# Patient Record
Sex: Male | Born: 1935 | Race: White | Hispanic: No | Marital: Married | Smoking: Former smoker
Health system: Southern US, Community
[De-identification: ages and names within clinical notes are randomized; demographics above are authoritative.]

## PROBLEM LIST (undated history)

## (undated) DIAGNOSIS — L299 Pruritus, unspecified: Secondary | ICD-10-CM

## (undated) DIAGNOSIS — Z86718 Personal history of other venous thrombosis and embolism: Secondary | ICD-10-CM

## (undated) DIAGNOSIS — I350 Nonrheumatic aortic (valve) stenosis: Secondary | ICD-10-CM

## (undated) DIAGNOSIS — I739 Peripheral vascular disease, unspecified: Secondary | ICD-10-CM

## (undated) DIAGNOSIS — I509 Heart failure, unspecified: Secondary | ICD-10-CM

## (undated) DIAGNOSIS — M1710 Unilateral primary osteoarthritis, unspecified knee: Secondary | ICD-10-CM

## (undated) DIAGNOSIS — Z22322 Carrier or suspected carrier of Methicillin resistant Staphylococcus aureus: Secondary | ICD-10-CM

## (undated) DIAGNOSIS — I4891 Unspecified atrial fibrillation: Secondary | ICD-10-CM

## (undated) DIAGNOSIS — N401 Enlarged prostate with lower urinary tract symptoms: Secondary | ICD-10-CM

## (undated) DIAGNOSIS — I10 Essential (primary) hypertension: Secondary | ICD-10-CM

## (undated) DIAGNOSIS — I442 Atrioventricular block, complete: Secondary | ICD-10-CM

## (undated) DIAGNOSIS — E43 Unspecified severe protein-calorie malnutrition: Secondary | ICD-10-CM

## (undated) DIAGNOSIS — I70219 Atherosclerosis of native arteries of extremities with intermittent claudication, unspecified extremity: Principal | ICD-10-CM

## (undated) DIAGNOSIS — Z950 Presence of cardiac pacemaker: Secondary | ICD-10-CM

## (undated) DIAGNOSIS — R636 Underweight: Secondary | ICD-10-CM

## (undated) DIAGNOSIS — L89309 Pressure ulcer of unspecified buttock, unspecified stage: Secondary | ICD-10-CM

## (undated) DIAGNOSIS — K2270 Barrett's esophagus without dysplasia: Secondary | ICD-10-CM

## (undated) DIAGNOSIS — F0391 Unspecified dementia with behavioral disturbance: Secondary | ICD-10-CM

## (undated) DIAGNOSIS — K219 Gastro-esophageal reflux disease without esophagitis: Secondary | ICD-10-CM

## (undated) DIAGNOSIS — IMO0002 Reserved for concepts with insufficient information to code with codable children: Secondary | ICD-10-CM

## (undated) HISTORY — PX: SKIN GRAFT: SHX250

## (undated) HISTORY — PX: TONSILLECTOMY: SUR1361

## (undated) HISTORY — DX: Reserved for concepts with insufficient information to code with codable children: IMO0002

## (undated) HISTORY — DX: Pressure ulcer of unspecified buttock, unspecified stage: L89.309

## (undated) HISTORY — DX: Presence of cardiac pacemaker: Z95.0

## (undated) HISTORY — DX: Atrioventricular block, complete: I44.2

## (undated) HISTORY — DX: Unspecified atrial fibrillation: I48.91

## (undated) HISTORY — DX: Pruritus, unspecified: L29.9

## (undated) HISTORY — DX: Heart failure, unspecified: I50.9

## (undated) HISTORY — DX: Unilateral primary osteoarthritis, unspecified knee: M17.10

## (undated) HISTORY — DX: Gastro-esophageal reflux disease without esophagitis: K21.9

## (undated) HISTORY — DX: Personal history of other venous thrombosis and embolism: Z86.718

## (undated) HISTORY — DX: Benign prostatic hyperplasia with lower urinary tract symptoms: N40.1

## (undated) HISTORY — PX: PACEMAKER INSERTION: SHX728

## (undated) HISTORY — DX: Essential (primary) hypertension: I10

## (undated) HISTORY — PX: FRACTURE SURGERY: SHX138

## (undated) HISTORY — PX: CATARACT EXTRACTION: SUR2

## (undated) HISTORY — DX: Peripheral vascular disease, unspecified: I73.9

## (undated) HISTORY — DX: Barrett's esophagus without dysplasia: K22.70

## (undated) HISTORY — DX: Atherosclerosis of native arteries of extremities with intermittent claudication, unspecified extremity: I70.219

---

## 2004-06-25 ENCOUNTER — Ambulatory Visit: Payer: Self-pay | Admitting: Internal Medicine

## 2004-06-25 ENCOUNTER — Ambulatory Visit: Payer: Self-pay

## 2004-07-31 ENCOUNTER — Ambulatory Visit: Payer: Self-pay | Admitting: Internal Medicine

## 2004-08-23 ENCOUNTER — Ambulatory Visit: Payer: Self-pay | Admitting: Cardiology

## 2004-09-03 ENCOUNTER — Ambulatory Visit: Payer: Self-pay | Admitting: Cardiology

## 2004-09-11 ENCOUNTER — Ambulatory Visit: Payer: Self-pay | Admitting: Internal Medicine

## 2004-09-16 ENCOUNTER — Ambulatory Visit: Payer: Self-pay | Admitting: Internal Medicine

## 2004-09-23 ENCOUNTER — Ambulatory Visit: Payer: Self-pay | Admitting: Internal Medicine

## 2004-09-30 ENCOUNTER — Ambulatory Visit: Payer: Self-pay | Admitting: Internal Medicine

## 2004-10-10 ENCOUNTER — Ambulatory Visit: Payer: Self-pay | Admitting: Cardiology

## 2004-11-03 ENCOUNTER — Ambulatory Visit: Payer: Self-pay | Admitting: Cardiology

## 2004-12-05 ENCOUNTER — Ambulatory Visit: Payer: Self-pay | Admitting: Cardiology

## 2005-01-08 ENCOUNTER — Ambulatory Visit: Payer: Self-pay | Admitting: Cardiology

## 2005-02-09 ENCOUNTER — Ambulatory Visit: Payer: Self-pay | Admitting: Cardiology

## 2005-03-13 ENCOUNTER — Ambulatory Visit: Payer: Self-pay | Admitting: Cardiology

## 2005-04-13 ENCOUNTER — Ambulatory Visit: Payer: Self-pay | Admitting: Cardiology

## 2005-05-15 ENCOUNTER — Ambulatory Visit: Payer: Self-pay | Admitting: Cardiology

## 2005-06-22 ENCOUNTER — Ambulatory Visit: Payer: Self-pay | Admitting: Cardiology

## 2005-07-15 ENCOUNTER — Ambulatory Visit: Payer: Self-pay | Admitting: Internal Medicine

## 2005-07-21 ENCOUNTER — Ambulatory Visit: Payer: Self-pay | Admitting: Cardiology

## 2005-09-15 ENCOUNTER — Ambulatory Visit: Payer: Self-pay | Admitting: Cardiology

## 2005-10-05 ENCOUNTER — Ambulatory Visit: Payer: Self-pay | Admitting: Internal Medicine

## 2005-10-16 ENCOUNTER — Ambulatory Visit: Payer: Self-pay | Admitting: Cardiology

## 2005-10-27 ENCOUNTER — Emergency Department (HOSPITAL_COMMUNITY): Admission: EM | Admit: 2005-10-27 | Discharge: 2005-10-28 | Payer: Self-pay | Admitting: Emergency Medicine

## 2005-11-25 ENCOUNTER — Ambulatory Visit: Payer: Self-pay | Admitting: Cardiology

## 2006-01-05 ENCOUNTER — Ambulatory Visit: Payer: Self-pay | Admitting: Cardiology

## 2006-02-08 ENCOUNTER — Ambulatory Visit: Payer: Self-pay | Admitting: Cardiology

## 2006-03-08 ENCOUNTER — Ambulatory Visit: Payer: Self-pay | Admitting: Cardiology

## 2006-04-07 ENCOUNTER — Ambulatory Visit: Payer: Self-pay | Admitting: Cardiology

## 2006-05-05 ENCOUNTER — Ambulatory Visit: Payer: Self-pay | Admitting: Cardiology

## 2006-06-23 ENCOUNTER — Ambulatory Visit: Payer: Self-pay | Admitting: Cardiology

## 2006-06-30 ENCOUNTER — Ambulatory Visit: Payer: Self-pay | Admitting: Internal Medicine

## 2006-07-21 ENCOUNTER — Ambulatory Visit: Payer: Self-pay | Admitting: Cardiology

## 2006-08-17 ENCOUNTER — Ambulatory Visit: Payer: Self-pay | Admitting: Cardiology

## 2006-09-15 ENCOUNTER — Ambulatory Visit: Payer: Self-pay | Admitting: Cardiology

## 2006-09-21 ENCOUNTER — Ambulatory Visit: Payer: Self-pay

## 2006-09-21 ENCOUNTER — Ambulatory Visit: Payer: Self-pay | Admitting: Cardiology

## 2006-09-21 LAB — CONVERTED CEMR LAB
CO2: 36 meq/L — ABNORMAL HIGH (ref 19–32)
Calcium: 8.9 mg/dL (ref 8.4–10.5)
Chloride: 103 meq/L (ref 96–112)
Creatinine, Ser: 1.1 mg/dL (ref 0.4–1.5)
Glucose, Bld: 108 mg/dL — ABNORMAL HIGH (ref 70–99)

## 2006-10-04 ENCOUNTER — Ambulatory Visit: Payer: Self-pay | Admitting: Internal Medicine

## 2006-10-04 LAB — CONVERTED CEMR LAB
ALT: 20 units/L (ref 0–40)
AST: 19 units/L (ref 0–37)
Albumin: 3.5 g/dL (ref 3.5–5.2)
Alkaline Phosphatase: 86 units/L (ref 39–117)
Bilirubin, Direct: 0.2 mg/dL (ref 0.0–0.3)
Total Bilirubin: 0.9 mg/dL (ref 0.3–1.2)
Total Protein: 6.1 g/dL (ref 6.0–8.3)

## 2006-10-13 ENCOUNTER — Ambulatory Visit: Payer: Self-pay | Admitting: Cardiology

## 2006-11-10 ENCOUNTER — Ambulatory Visit: Payer: Self-pay | Admitting: Cardiology

## 2006-11-12 ENCOUNTER — Ambulatory Visit: Payer: Self-pay | Admitting: Internal Medicine

## 2006-12-08 ENCOUNTER — Ambulatory Visit: Payer: Self-pay | Admitting: Cardiology

## 2007-01-05 ENCOUNTER — Ambulatory Visit: Payer: Self-pay | Admitting: Cardiology

## 2007-02-02 ENCOUNTER — Ambulatory Visit: Payer: Self-pay | Admitting: Cardiology

## 2007-03-02 ENCOUNTER — Ambulatory Visit: Payer: Self-pay | Admitting: Cardiology

## 2007-04-01 ENCOUNTER — Ambulatory Visit: Payer: Self-pay | Admitting: Cardiology

## 2007-04-27 ENCOUNTER — Ambulatory Visit: Payer: Self-pay | Admitting: Cardiology

## 2007-05-16 ENCOUNTER — Ambulatory Visit: Payer: Self-pay | Admitting: Internal Medicine

## 2007-05-16 DIAGNOSIS — K219 Gastro-esophageal reflux disease without esophagitis: Secondary | ICD-10-CM

## 2007-05-16 DIAGNOSIS — I10 Essential (primary) hypertension: Secondary | ICD-10-CM | POA: Insufficient documentation

## 2007-05-16 HISTORY — DX: Gastro-esophageal reflux disease without esophagitis: K21.9

## 2007-05-16 HISTORY — DX: Essential (primary) hypertension: I10

## 2007-05-25 ENCOUNTER — Ambulatory Visit: Payer: Self-pay | Admitting: Cardiology

## 2007-06-13 ENCOUNTER — Ambulatory Visit: Payer: Self-pay | Admitting: Internal Medicine

## 2007-06-22 ENCOUNTER — Ambulatory Visit: Payer: Self-pay | Admitting: Cardiology

## 2007-07-20 ENCOUNTER — Ambulatory Visit: Payer: Self-pay | Admitting: Cardiology

## 2007-08-17 ENCOUNTER — Ambulatory Visit: Payer: Self-pay | Admitting: Cardiology

## 2007-09-12 ENCOUNTER — Telehealth: Payer: Self-pay | Admitting: Internal Medicine

## 2007-10-13 ENCOUNTER — Ambulatory Visit: Payer: Self-pay | Admitting: Cardiology

## 2007-10-19 ENCOUNTER — Ambulatory Visit: Payer: Self-pay | Admitting: Internal Medicine

## 2007-10-31 ENCOUNTER — Ambulatory Visit: Payer: Self-pay | Admitting: Internal Medicine

## 2007-11-14 ENCOUNTER — Ambulatory Visit: Payer: Self-pay

## 2007-11-15 ENCOUNTER — Ambulatory Visit: Payer: Self-pay | Admitting: Internal Medicine

## 2007-11-15 LAB — CONVERTED CEMR LAB
Basophils Absolute: 0 10*3/uL (ref 0.0–0.1)
Basophils Relative: 0 % (ref 0.0–1.0)
Chloride: 101 meq/L (ref 96–112)
Creatinine, Ser: 1.1 mg/dL (ref 0.4–1.5)
Eosinophils Absolute: 0.1 10*3/uL (ref 0.0–0.7)
GFR calc Af Amer: 81 mL/min
GFR calc non Af Amer: 67 mL/min
HCT: 47.7 % (ref 39.0–52.0)
MCHC: 33.6 g/dL (ref 30.0–36.0)
MCV: 97.4 fL (ref 78.0–100.0)
Monocytes Absolute: 0.3 10*3/uL (ref 0.1–1.0)
Neutrophils Relative %: 79.4 % — ABNORMAL HIGH (ref 43.0–77.0)
Platelets: 232 10*3/uL (ref 150–400)
RBC: 4.9 M/uL (ref 4.22–5.81)
aPTT: 32.1 s — ABNORMAL HIGH (ref 21.7–29.8)

## 2007-11-18 ENCOUNTER — Ambulatory Visit (HOSPITAL_COMMUNITY): Admission: RE | Admit: 2007-11-18 | Discharge: 2007-11-18 | Payer: Self-pay | Admitting: Internal Medicine

## 2007-11-18 ENCOUNTER — Ambulatory Visit: Payer: Self-pay | Admitting: Internal Medicine

## 2007-11-28 ENCOUNTER — Ambulatory Visit: Payer: Self-pay | Admitting: Internal Medicine

## 2008-06-06 ENCOUNTER — Encounter: Payer: Self-pay | Admitting: Cardiology

## 2008-06-06 DIAGNOSIS — I442 Atrioventricular block, complete: Secondary | ICD-10-CM | POA: Insufficient documentation

## 2008-06-06 DIAGNOSIS — Z950 Presence of cardiac pacemaker: Secondary | ICD-10-CM

## 2008-06-06 DIAGNOSIS — I70219 Atherosclerosis of native arteries of extremities with intermittent claudication, unspecified extremity: Secondary | ICD-10-CM

## 2008-06-06 HISTORY — DX: Atherosclerosis of native arteries of extremities with intermittent claudication, unspecified extremity: I70.219

## 2008-06-06 HISTORY — DX: Presence of cardiac pacemaker: Z95.0

## 2008-06-06 HISTORY — DX: Atrioventricular block, complete: I44.2

## 2008-06-07 ENCOUNTER — Ambulatory Visit: Payer: Self-pay | Admitting: Cardiology

## 2008-06-11 ENCOUNTER — Ambulatory Visit: Payer: Self-pay | Admitting: Internal Medicine

## 2008-07-05 ENCOUNTER — Ambulatory Visit: Payer: Self-pay | Admitting: Internal Medicine

## 2008-07-12 ENCOUNTER — Ambulatory Visit: Payer: Self-pay | Admitting: Internal Medicine

## 2008-07-12 DIAGNOSIS — IMO0002 Reserved for concepts with insufficient information to code with codable children: Secondary | ICD-10-CM | POA: Insufficient documentation

## 2008-07-12 DIAGNOSIS — M1710 Unilateral primary osteoarthritis, unspecified knee: Secondary | ICD-10-CM

## 2008-07-12 HISTORY — DX: Reserved for concepts with insufficient information to code with codable children: IMO0002

## 2008-07-18 ENCOUNTER — Telehealth: Payer: Self-pay | Admitting: Internal Medicine

## 2008-07-23 ENCOUNTER — Encounter: Payer: Self-pay | Admitting: Internal Medicine

## 2008-08-07 ENCOUNTER — Ambulatory Visit: Payer: Self-pay | Admitting: Internal Medicine

## 2008-08-16 ENCOUNTER — Telehealth: Payer: Self-pay | Admitting: Internal Medicine

## 2008-10-23 ENCOUNTER — Telehealth: Payer: Self-pay | Admitting: Internal Medicine

## 2008-10-29 ENCOUNTER — Telehealth: Payer: Self-pay | Admitting: Internal Medicine

## 2008-11-07 ENCOUNTER — Encounter: Payer: Self-pay | Admitting: Cardiology

## 2008-11-14 ENCOUNTER — Encounter: Payer: Self-pay | Admitting: Internal Medicine

## 2008-11-14 ENCOUNTER — Ambulatory Visit: Payer: Self-pay | Admitting: Internal Medicine

## 2008-12-31 ENCOUNTER — Ambulatory Visit: Payer: Self-pay | Admitting: Cardiology

## 2009-04-02 ENCOUNTER — Ambulatory Visit: Payer: Self-pay | Admitting: Cardiology

## 2009-05-09 ENCOUNTER — Encounter: Payer: Self-pay | Admitting: Internal Medicine

## 2009-06-04 ENCOUNTER — Ambulatory Visit: Payer: Self-pay | Admitting: Internal Medicine

## 2009-06-04 DIAGNOSIS — Z86718 Personal history of other venous thrombosis and embolism: Secondary | ICD-10-CM

## 2009-06-04 DIAGNOSIS — L89609 Pressure ulcer of unspecified heel, unspecified stage: Secondary | ICD-10-CM | POA: Insufficient documentation

## 2009-06-04 HISTORY — DX: Personal history of other venous thrombosis and embolism: Z86.718

## 2009-07-02 ENCOUNTER — Ambulatory Visit: Payer: Self-pay | Admitting: Cardiology

## 2009-07-02 ENCOUNTER — Ambulatory Visit: Payer: Self-pay | Admitting: Internal Medicine

## 2009-07-02 DIAGNOSIS — L89309 Pressure ulcer of unspecified buttock, unspecified stage: Secondary | ICD-10-CM | POA: Insufficient documentation

## 2009-07-03 ENCOUNTER — Telehealth: Payer: Self-pay | Admitting: Internal Medicine

## 2009-07-06 ENCOUNTER — Telehealth: Payer: Self-pay | Admitting: Family Medicine

## 2009-07-17 ENCOUNTER — Ambulatory Visit: Payer: Self-pay | Admitting: Internal Medicine

## 2009-07-21 ENCOUNTER — Ambulatory Visit: Payer: Self-pay | Admitting: Internal Medicine

## 2009-07-23 ENCOUNTER — Telehealth: Payer: Self-pay | Admitting: Family Medicine

## 2009-08-05 ENCOUNTER — Ambulatory Visit: Payer: Self-pay | Admitting: Internal Medicine

## 2009-08-13 ENCOUNTER — Telehealth: Payer: Self-pay | Admitting: Internal Medicine

## 2009-08-16 ENCOUNTER — Encounter: Payer: Self-pay | Admitting: Internal Medicine

## 2009-08-17 ENCOUNTER — Emergency Department (HOSPITAL_COMMUNITY): Admission: EM | Admit: 2009-08-17 | Discharge: 2009-08-17 | Payer: Self-pay | Admitting: Emergency Medicine

## 2009-08-19 ENCOUNTER — Telehealth: Payer: Self-pay | Admitting: Internal Medicine

## 2009-08-23 ENCOUNTER — Encounter: Payer: Self-pay | Admitting: Internal Medicine

## 2009-08-23 ENCOUNTER — Encounter: Payer: Self-pay | Admitting: Cardiology

## 2009-08-27 ENCOUNTER — Telehealth: Payer: Self-pay | Admitting: Internal Medicine

## 2009-08-27 ENCOUNTER — Encounter: Payer: Self-pay | Admitting: Internal Medicine

## 2009-08-27 ENCOUNTER — Encounter: Payer: Self-pay | Admitting: Cardiology

## 2009-08-29 ENCOUNTER — Encounter: Payer: Self-pay | Admitting: Internal Medicine

## 2009-09-02 ENCOUNTER — Ambulatory Visit: Payer: Self-pay | Admitting: Internal Medicine

## 2009-09-02 DIAGNOSIS — N138 Other obstructive and reflux uropathy: Secondary | ICD-10-CM

## 2009-09-02 DIAGNOSIS — N401 Enlarged prostate with lower urinary tract symptoms: Secondary | ICD-10-CM | POA: Insufficient documentation

## 2009-09-02 HISTORY — DX: Other obstructive and reflux uropathy: N13.8

## 2009-09-03 ENCOUNTER — Encounter: Payer: Self-pay | Admitting: Internal Medicine

## 2009-09-03 LAB — CONVERTED CEMR LAB
ALT: 10 units/L (ref 0–53)
AST: 16 units/L (ref 0–37)
Alkaline Phosphatase: 86 units/L (ref 39–117)
BUN: 14 mg/dL (ref 6–23)
Basophils Relative: 0.1 % (ref 0.0–3.0)
Bilirubin, Direct: 0.2 mg/dL (ref 0.0–0.3)
Calcium: 9 mg/dL (ref 8.4–10.5)
Chloride: 107 meq/L (ref 96–112)
Creatinine, Ser: 1 mg/dL (ref 0.4–1.5)
Eosinophils Relative: 2.4 % (ref 0.0–5.0)
GFR calc non Af Amer: 74.67 mL/min (ref >60)
Lymphocytes Relative: 13.9 % (ref 12.0–46.0)
MCV: 95.3 fL (ref 78.0–100.0)
Monocytes Relative: 7.7 % (ref 3.0–12.0)
Neutrophils Relative %: 75.9 % (ref 43.0–77.0)
Platelets: 197 10*3/uL (ref 150.0–400.0)
RBC: 4.23 M/uL (ref 4.22–5.81)
Total Bilirubin: 0.8 mg/dL (ref 0.3–1.2)
Total Protein: 6.2 g/dL (ref 6.0–8.3)
WBC: 5.9 10*3/uL (ref 4.5–10.5)

## 2009-09-11 ENCOUNTER — Encounter (INDEPENDENT_AMBULATORY_CARE_PROVIDER_SITE_OTHER): Payer: Self-pay | Admitting: Orthopedic Surgery

## 2009-09-11 ENCOUNTER — Ambulatory Visit (HOSPITAL_COMMUNITY): Admission: RE | Admit: 2009-09-11 | Discharge: 2009-09-11 | Payer: Self-pay | Admitting: Orthopedic Surgery

## 2009-09-11 ENCOUNTER — Ambulatory Visit: Payer: Self-pay | Admitting: Vascular Surgery

## 2009-09-16 ENCOUNTER — Encounter: Payer: Self-pay | Admitting: Internal Medicine

## 2009-09-30 ENCOUNTER — Encounter: Admission: RE | Admit: 2009-09-30 | Discharge: 2009-12-29 | Payer: Self-pay | Admitting: Internal Medicine

## 2009-10-01 ENCOUNTER — Ambulatory Visit: Payer: Self-pay | Admitting: Cardiology

## 2009-10-03 ENCOUNTER — Telehealth: Payer: Self-pay | Admitting: Internal Medicine

## 2009-10-29 ENCOUNTER — Encounter: Payer: Self-pay | Admitting: Cardiology

## 2009-10-29 ENCOUNTER — Encounter: Payer: Self-pay | Admitting: Internal Medicine

## 2009-11-03 ENCOUNTER — Encounter: Payer: Self-pay | Admitting: Internal Medicine

## 2009-11-04 ENCOUNTER — Ambulatory Visit: Payer: Self-pay | Admitting: Internal Medicine

## 2009-11-13 ENCOUNTER — Ambulatory Visit: Payer: Self-pay | Admitting: Internal Medicine

## 2009-11-19 ENCOUNTER — Ambulatory Visit: Payer: Self-pay | Admitting: Internal Medicine

## 2009-11-27 ENCOUNTER — Encounter: Payer: Self-pay | Admitting: Internal Medicine

## 2009-11-27 ENCOUNTER — Telehealth: Payer: Self-pay | Admitting: Internal Medicine

## 2009-12-25 ENCOUNTER — Encounter: Payer: Self-pay | Admitting: Internal Medicine

## 2010-05-01 ENCOUNTER — Ambulatory Visit: Payer: Self-pay | Admitting: Cardiology

## 2010-05-23 ENCOUNTER — Encounter (INDEPENDENT_AMBULATORY_CARE_PROVIDER_SITE_OTHER): Payer: Self-pay | Admitting: *Deleted

## 2010-05-26 ENCOUNTER — Ambulatory Visit: Payer: Self-pay | Admitting: Internal Medicine

## 2010-07-31 ENCOUNTER — Encounter: Payer: Self-pay | Admitting: Cardiology

## 2010-08-15 ENCOUNTER — Ambulatory Visit: Payer: Self-pay

## 2010-08-25 ENCOUNTER — Encounter: Payer: Self-pay | Admitting: Internal Medicine

## 2010-08-26 NOTE — Progress Notes (Signed)
Summary: [facility] called re: status power wheelchair paperwork  Phone Note From Other Clinic Call back at [facility] [phone] Baird Lyons   Caller: [facility] - Baird Lyons Summary of Call: Calling to check on status of paperwork for power wheelchair. Faxed on 11/18/09. Needed to be completed and signed asap. Please call.  Initial call taken by: Lucy Antigua,  Nov 27, 2009 10:00 AM  Follow-up for Phone Call        form completed and faxed.jennifer at [facility] notified. Follow-up by: Gladis Riffle, RN,  Nov 27, 2009 3:26 PM

## 2010-08-26 NOTE — Cardiovascular Report (Signed)
Summary: Office Visit   Office Visit   Imported By: Roderic Ovens 11/25/2009 15:23:11  _____________________________________________________________________  External Attachment:    Type:   Image     Comment:   External Document

## 2010-08-26 NOTE — Miscellaneous (Signed)
Summary: Physician's Orders/[facility]  Physician's Orders/[facility]   Imported By: Maryln Gottron 09/03/2009 12:46:15  _____________________________________________________________________  External Attachment:    Type:   Image     Comment:   External Document

## 2010-08-26 NOTE — Progress Notes (Signed)
Summary: advice andrequest for Nasonex  Phone Note Call from Patient   Caller: Spouse Call For: Birdie Sons MD Summary of Call: Pt's wife calls stating pt's bed sores on buttocks are worse and needs some advice.  They do not have home health anymore.  He would like to have a written prescription for Nasonex to send in to their mail order pharmacy.  The power chair is delayed for 50 more days. [phone] Initial call taken by: Lynann Beaver CMA,  October 03, 2009 10:13 AM  Follow-up for Phone Call        nasonex ok call vendor for wheelchair home health skin care Follow-up by: Birdie Sons MD,  October 03, 2009 11:52 AM    New/Updated Medications: NASONEX 50 MCG/ACT SUSP (MOMETASONE FUROATE) 2 sprays each nostril once daily Prescriptions: NASONEX 50 MCG/ACT SUSP (MOMETASONE FUROATE) 2 sprays each nostril once daily  #1 x 6   Entered by:   Lynann Beaver CMA   Authorized by:   Birdie Sons MD   Signed by:   Lynann Beaver CMA on 10/03/2009   Method used:   Print then Give to Patient   RxID:   [id number]  Pt's wife is keeping in touch with the vendor for the chair........it is just delayed in processing, they tell her. Home Health referral sent to [facility].

## 2010-08-26 NOTE — Medication Information (Signed)
Summary: Order for Power Wheelchair  Order for Power Wheelchair   Imported By: Maryln Gottron 12/04/2009 15:51:29  _____________________________________________________________________  External Attachment:    Type:   Image     Comment:   External Document

## 2010-08-26 NOTE — Assessment & Plan Note (Signed)
Summary: pc2  Medications Added NASONEX 50 MCG/ACT SUSP (MOMETASONE FUROATE) UAD      Allergies Added:   Visit Type:  Follow-up Primary Provider:  Birdie Sons MD   History of Present Illness: [name] [name] returns today for follow-up.  He is a pleasant elderly man with a h/o symptomatic CHB, s/p PPM in 1996 and a generator change two years ago.  He has been stable except that he tripped, fell and broke his hip.  He denies palpitations, c/p, or SOB.  He has mild peripheral edema which has been chronic. He is bothered by severe arthritis in his knees.  Current Medications (verified): 1)  Nexium 40 Mg  Cpdr (Esomeprazole Magnesium) .... One By Mouth Deaily 2)  Fexofenadine Hcl 180 Mg  Tabs (Fexofenadine Hcl) .... Take 1 Tablet By Mouth Once A Day As Needed 3)  Mirapex 0.25 Mg Tabs (Pramipexole Dihydrochloride) .... Take 1 Tablet By Mouth At Bedtime As Needed 4)  Aspirin 81 Mg Tbec (Aspirin) .... Once Daily 5)  Multivitamins  Tabs (Multiple Vitamin) .... Once Daily 6)  Nasonex 50 Mcg/act Susp (Mometasone Furoate) .... Uad 7)  Calcium-Vitamin D 600-200 Mg-Unit Tabs (Calcium-Vitamin D) .... Two Times A Day 8)  Hydrocodone-Acetaminophen 5-325 Mg Tabs (Hydrocodone-Acetaminophen) .Marland Kitchen.. 1 By Mouth Up To 4 Times Per Day As Needed For Pain 9)  Flomax 0.4 Mg Caps (Tamsulosin Hcl) .... Take 1 Tablet By Mouth Once A Day  Allergies (verified): 1)  ! Cortisone Acetate (Cortisone Acetate)  Past History:  Past Medical History: Last updated: 06/04/2009 .Current Problems:  PRURITUS (ICD-698.9) OSTEOARTHRITIS, KNEES, BILATERAL (ICD-715.96) DECUBITUS ULCER, BUTTOCK (ICD-707.05) HYPERTENSION, BENIGN (ICD-401.1) ATHEROSCLEROSIS W /INT CLAUDICATION (ICD-440.21) PACEMAKER (ICD-V45.Marland Kitchen01) AV BLOCK, COMPLETE (ICD-426.0) FUNGAL DERMATITIS (ICD-111.9) ULCER, DECUBITUS, LOWER BACK (ICD-707.03) HYPERTENSION (ICD-401.9) GERD (ICD-530.81) 1. Status post synchrony II DDD pacemaker implantation in 1996 for AV  block, S/P generator change with [facility] DDD pacer 4/09 2. Recent onset of probable atrial fibrillation. 3. Peripheral vascular disease with reduced ABIs of 0.47 and 0.43. 4. Hypertension.  DVT, hx of  Past Surgical History: Last updated: 06/04/2009 PPM 11/18/2007  Doylene Canning. Ladona Ridgel, MD   Cataract Surgery hip fracture---2010 graft---skin to heel  Review of Systems  The patient denies chest pain, syncope, dyspnea on exertion, and peripheral edema.    Vital Signs:  Patient profile:   77 year old male Height:      70 inches Weight:      149 pounds Pulse rate:   89 / minute BP sitting:   80 / 50  (left arm)  Vitals Entered By: Laurance Flatten CMA (November 19, 2009 3:30 PM)  Physical Exam  General:  elderly man sitting in a wheelchair. Head:  normocephalic and atraumatic.   Eyes:  pupils equal and pupils round.   Neck:  Neck supple, no JVD. No masses, thyromegaly or abnormal cervical nodes. Chest Wall:  Well healed pacemaker incision. Lungs:  Clear bilaterally to auscultation with no wheezes, rales, or rhonchi. Heart:  Normal rate and regular rhythm. S1 and S2 normal without gallop, murmur, click, rub or other extra sounds. Abdomen:  Bowel sounds positive,abdomen soft and non-tender without masses, organomegaly or hernias noted. Msk:  No deformity or scoliosis noted of thoracic or lumbar spine.  Severe arthritis in the knees. Pulses:  R radial normal and L radial normal.   Extremities:  2+ left pedal edema and 2+ right pedal edema.   Neurologic:  cranial nerves II-XII intact.  in wheelchair   PPM Specifications  Following MD:  Everardo Beals Juanda Chance, MD     PPM Vendor:  [employer]     PPM Model Number:  [id number]     PPM Serial Number:  [id number] PPM DOI:  11/18/2007     PPM Implanting MD:  Everardo Beals. Juanda Chance, MD  Lead 1    Location: RA     DOI: 06/11/1995     Model #: [id number]     Serial #: CC     Status: [id number] Lead 2    Location: A     Model #: 06/11/1995     Serial #: [id number]     Status:  [id number] Lead 3    Location: A      Magnet Response Rate:  BOL 98.6 ERI 86.3  Indications:  TTM'S with [employer]   PPM Follow Up Remote Check?  No Battery Voltage:  2.81 V     Battery Est. Longevity:  7 years     Pacer Dependent:  No       PPM Device Measurements Atrium  Amplitude: 2.3 mV, Impedance: 338 ohms, Threshold: 0.75 V at 0.5 msec Right Ventricle  Amplitude: 5.7 mV, Impedance: 563 ohms, Threshold: 0.5 V at 0.5 msec  Episodes MS Episodes:  35659     Percent Mode Switch:  5.2%     Atrial Pacing:  12%     Ventricular Pacing:  88%  Parameters Mode:  DDD     Lower Rate Limit:  60     Upper Rate Limit:  100 Paced AV Delay:  150     Sensed AV Delay:  170 Next Cardiology Appt Due:  04/26/2010 Tech Comments:  No parameter changes.  TTM's with [employer].  Checked by [employer].  ROV 6 months clinic. Altha Harm, LPN  November 19, 2009 3:38 PM  MD Comments:  Agree with above.  Impression & Recommendations:  Problem # 1:  PACEMAKER (ICD-V45.Marland Kitchen01) His device is working normally.  Will recheck in several months.  Problem # 2:  HYPERTENSION (ICD-401.9) His blood pressure is now on the low side and he is encouraged to increase fluid and salt intake. His updated medication list for this problem includes:    Aspirin 81 Mg Tbec (Aspirin) ..... Once daily  Patient Instructions: 1)  Your physician recommends that you schedule a follow-up appointment in: 6 months with device clinic and 12 months with Dr Ladona Ridgel

## 2010-08-26 NOTE — Letter (Signed)
Summary: [facility] Office Visit Note  [facility] Office Visit Note   Imported By: Roderic Ovens 03/04/2010 12:45:02  _____________________________________________________________________  External Attachment:    Type:   Image     Comment:   External Document

## 2010-08-26 NOTE — Medication Information (Signed)
Summary: Order for Wheelchair  Order for Wheelchair   Imported By: Maryln Gottron 09/02/2009 10:12:13  _____________________________________________________________________  External Attachment:    Type:   Image     Comment:   External Document

## 2010-08-26 NOTE — Cardiovascular Report (Signed)
Summary: TTM   TTM   Imported By: Roderic Ovens 09/03/2009 16:02:15  _____________________________________________________________________  External Attachment:    Type:   Image     Comment:   External Document

## 2010-08-26 NOTE — Cardiovascular Report (Signed)
Summary: TTM   TTM   Imported By: Roderic Ovens 10/24/2009 13:34:27  _____________________________________________________________________  External Attachment:    Type:   Image     Comment:   External Document

## 2010-08-26 NOTE — Progress Notes (Signed)
Summary: [facility] inquiry  Phone Note From Other Clinic   Caller: scooter Nurse, adult Call For: [employer] Summary of Call: Have you decided to go with [employer] or another company?   Can I be of any assistance?  In home eval per pt Thurs.   Call me at [phone] or fax [phone] paperwork or cover sheet which way.   Initial call taken by: Rudy Jew, RN,  August 27, 2009 11:44 AM  Follow-up for Phone Call        pt is making a decision Follow-up by: Birdie Sons MD,  August 27, 2009 12:33 PM  Additional Follow-up for Phone Call Additional follow up Details #1::        Phone call completed Additional Follow-up by: Rudy Jew, RN,  August 27, 2009 1:36 PM

## 2010-08-26 NOTE — Progress Notes (Signed)
Summary: needs status  Phone Note Other Incoming Call back at [phone]   Caller: [facility] Summary of Call: wants to know status of form for wheelchair. He wants nurse to return his call. Initial call taken by: Warnell Forester,  August 13, 2009 10:34 AM  Follow-up for Phone Call        [facility]  [facility] store notified they are in holding pattern until pt sees if can get locally. Follow-up by: Gladis Riffle, RN,  August 13, 2009 3:21 PM

## 2010-08-26 NOTE — Letter (Signed)
Summary: Records from [facility] 2010  Records from [facility] 2010   Imported By: Maryln Gottron 09/02/2009 14:30:09  _____________________________________________________________________  External Attachment:    Type:   Image     Comment:   External Document

## 2010-08-26 NOTE — Letter (Signed)
Summary: [facility]  [facility]   Imported By: Maryln Gottron 09/19/2009 11:17:32  _____________________________________________________________________  External Attachment:    Type:   Image     Comment:   External Document

## 2010-08-26 NOTE — Miscellaneous (Signed)
Summary: dx correction  Clinical Lists Changes  Problems: Changed problem from PACEMAKER (ICD-V45..01) to PACEMAKER, PERMANENT (ICD-V45.01) changed the incorrect dx code to correct dx code Donna Keene  May 23, 2010 12:37 PM 

## 2010-08-26 NOTE — Letter (Signed)
Summary: [facility]  [facility]   Imported By: Maryln Gottron 11/05/2009 12:42:56  _____________________________________________________________________  External Attachment:    Type:   Image     Comment:   External Document

## 2010-08-26 NOTE — Miscellaneous (Signed)
Summary: Certification and Plan of Care/[facility]  Certification and Plan of Care/[facility]   Imported By: Maryln Gottron 11/06/2009 10:43:32  _____________________________________________________________________  External Attachment:    Type:   Image     Comment:   External Document

## 2010-08-26 NOTE — Medication Information (Signed)
Summary: Rx for [employer]  Rx for [employer]   Imported By: Maryln Gottron 09/09/2009 15:19:54  _____________________________________________________________________  External Attachment:    Type:   Image     Comment:   External Document

## 2010-08-26 NOTE — Letter (Signed)
Summary: [facility] Office Note  [facility] Office Note   Imported By: Roderic Ovens 09/10/2009 14:10:05  _____________________________________________________________________  External Attachment:    Type:   Image     Comment:   External Document

## 2010-08-26 NOTE — Assessment & Plan Note (Signed)
Summary: 1 month rov/njr   Vital Signs:  Patient profile:   77 year old male Temp:     97.5 degrees F Pulse rate:   68 / minute Pulse rhythm:   irregular Resp:     12 per minute BP sitting:   128 / 78  (left arm)  Vitals Entered By: Gladis Riffle, RN (September 02, 2009 11:37 AM) CC: 1 month rov Is Patient Diabetic? No Comments went to ER 2 weeks ago for bladder pain, put on flomax with relief   CC:  1 month rov.  History of Present Illness: feels ok has had episode of urinary retention---see dr Charlott Holler note. tolerating flomax well  no wheelchair yet---working through paper work and bureaucracy  non healing wounds---home health nurse  OA knees-bilateral. he is essentially wheelchair bound due to pain and inability to walk  All other systems reviewed and were negative   Preventive Screening-Counseling & Management  Alcohol-Tobacco     Smoking Status: quit > 6 months     Year Started: 1942     Year Quit: 1946  Current Medications (verified): 1)  Nexium 40 Mg  Cpdr (Esomeprazole Magnesium) .... One By Mouth Deaily 2)  Fexofenadine Hcl 180 Mg  Tabs (Fexofenadine Hcl) .... Take 1 Tablet By Mouth Once A Day As Needed 3)  Mirapex 0.25 Mg Tabs (Pramipexole Dihydrochloride) .... Take 1 Tablet By Mouth At Bedtime As Needed 4)  Aspirin 81 Mg Tbec (Aspirin) .... Once Daily 5)  Multivitamins  Tabs (Multiple Vitamin) .... Once Daily 6)  Astepro 137 Mcg/spray Soln (Azelastine Hcl) .Marland Kitchen.. 1-2 Sprays Once Daily As Needed 7)  Calcium-Vitamin D 600-200 Mg-Unit Tabs (Calcium-Vitamin D) .... Two Times A Day 8)  Hydrocodone-Acetaminophen 5-325 Mg Tabs (Hydrocodone-Acetaminophen) .Marland Kitchen.. 1 By Mouth Up To 4 Times Per Day As Needed For Pain 9)  Flomax 0.4 Mg Caps (Tamsulosin Hcl) .... Take 1 Tablet By Mouth Once A Day  Allergies: 1)  ! Cortisone Acetate (Cortisone Acetate)  Past History:  Past Medical History: Last updated: 06/04/2009 .Current Problems:  PRURITUS  (ICD-698.9) OSTEOARTHRITIS, KNEES, BILATERAL (ICD-715.96) DECUBITUS ULCER, BUTTOCK (ICD-707.05) HYPERTENSION, BENIGN (ICD-401.1) ATHEROSCLEROSIS W /INT CLAUDICATION (ICD-440.21) PACEMAKER (ICD-V45.Marland Kitchen01) AV BLOCK, COMPLETE (ICD-426.0) FUNGAL DERMATITIS (ICD-111.9) ULCER, DECUBITUS, LOWER BACK (ICD-707.03) HYPERTENSION (ICD-401.9) GERD (ICD-530.81) 1. Status post synchrony II DDD pacemaker implantation in 1996 for AV     block, S/P generator change with [facility] DDD pacer 4/09 2. Recent onset of probable atrial fibrillation. 3. Peripheral vascular disease with reduced ABIs of 0.47 and 0.43. 4. Hypertension.  DVT, hx of  Past Surgical History: Last updated: 06/04/2009 PPM 11/18/2007  Doylene Canning. Ladona Ridgel, MD   Cataract Surgery hip fracture---2010 graft---skin to heel  Social History: Last updated: 05/16/2007 Married Never Smoked Regular exercise-no  Risk Factors: Exercise: no (05/16/2007)  Risk Factors: Smoking Status: quit > 6 months (09/02/2009)  Physical Exam  General:  elderly man sitting in a wheelchair. Head:  normocephalic and atraumatic.   Eyes:  pupils equal and pupils round.   Ears:  R ear normal and L ear normal.   Neck:  Neck supple, no JVD. No masses, thyromegaly or abnormal cervical nodes. Chest Wall:  Well healed pacemaker incision. Lungs:  Clear bilaterally to auscultation and percussion. Heart:  Normal rate and regular rhythm. S1 and S2 normal without gallop, murmur, click, rub or other extra sounds. Abdomen:  Bowel sounds positive,abdomen soft and non-tender without masses, organomegaly or hernias noted. Msk:  No deformity or scoliosis noted of  thoracic or lumbar spine.   Extremities:  2+ left pedal edema and 2+ right pedal edema.     Impression & Recommendations:  Problem # 1:  DECUBITUS ULCER, HEEL (ICD-707.07) has heeled thick eschar present  Problem # 2:  HYPERTENSION (ICD-401.9) adequate control continue current medications   Orders: Venipuncture (54098) TLB-BMP (Basic Metabolic Panel-BMET) (80048-METABOL)  BP today: 128/78 Prior BP: 122/80 (08/05/2009)  Labs Reviewed: K+: 4.2 (11/15/2007) Creat: : 1.1 (11/15/2007)     Problem # 3:  BENIGN PROSTATIC HYPERTROPHY, WITH OBSTRUCTION (ICD-600.01) much improved His updated medication list for this problem includes:    Flomax 0.4 Mg Caps (Tamsulosin hcl) .Marland Kitchen... Take 1 tablet by mouth once a day  Problem # 4:  OSTEOARTHRITIS, KNEES, BILATERAL (ICD-715.96) discussed at length. pt has done well with end stage OA of knees for years he is now immobile I am not sure what an orthopedic surgeon might have to offer but I'll refer.  Clearly not an ideal surgical candidate but if surgeon and patient concluded that surgery was the right choice I would clear him for surgery His updated medication list for this problem includes:    Aspirin 81 Mg Tbec (Aspirin) ..... Once daily    Hydrocodone-acetaminophen 5-325 Mg Tabs (Hydrocodone-acetaminophen) .Marland Kitchen... 1 by mouth up to 4 times per day as needed for pain  Orders: Orthopedic Surgeon Referral (Ortho Surgeon)  Complete Medication List: 1)  Nexium 40 Mg Cpdr (Esomeprazole magnesium) .... One by mouth deaily 2)  Fexofenadine Hcl 180 Mg Tabs (Fexofenadine hcl) .... Take 1 tablet by mouth once a day as needed 3)  Mirapex 0.25 Mg Tabs (Pramipexole dihydrochloride) .... Take 1 tablet by mouth at bedtime as needed 4)  Aspirin 81 Mg Tbec (Aspirin) .... Once daily 5)  Multivitamins Tabs (Multiple vitamin) .... Once daily 6)  Astepro 137 Mcg/spray Soln (Azelastine hcl) .Marland Kitchen.. 1-2 sprays once daily as needed 7)  Calcium-vitamin D 600-200 Mg-unit Tabs (Calcium-vitamin d) .... Two times a day 8)  Hydrocodone-acetaminophen 5-325 Mg Tabs (Hydrocodone-acetaminophen) .Marland Kitchen.. 1 by mouth up to 4 times per day as needed for pain 9)  Flomax 0.4 Mg Caps (Tamsulosin hcl) .... Take 1 tablet by mouth once a day  Other Orders: TLB-CBC Platelet -  w/Differential (85025-CBCD) TLB-Hepatic/Liver Function Pnl (80076-HEPATIC)

## 2010-08-26 NOTE — Letter (Signed)
Summary: [facility] Office Note  [facility] Office Note   Imported By: Roderic Ovens 09/10/2009 14:14:49  _____________________________________________________________________  External Attachment:    Type:   Image     Comment:   External Document

## 2010-08-26 NOTE — Letter (Signed)
Summary: [facility]  [facility]   Imported By: Maryln Gottron 09/16/2009 14:09:46  _____________________________________________________________________  External Attachment:    Type:   Image     Comment:   External Document

## 2010-08-26 NOTE — Assessment & Plan Note (Signed)
Summary: spot on leg that wont heal//mhf   Vital Signs:  Patient Profile:   77 Years Old Male Pulse rate:   64 / minute Resp:     16 per minute BP sitting:   138 / 68  (left arm)  Vitals Entered By: Gladis Riffle, RN (October 19, 2007 2:52 PM)                 Chief Complaint:  c/o lesion near buttocks that does not heal x 4-5 weeks and some drainage--has tried triamcinolone cream.  History of Present Illness: non healing wound on buttocks he sits a lot.     Current Allergies (reviewed today): No known allergies         Impression & Recommendations:  Problem # 1:  FUNGAL DERMATITIS (ICD-111.9) he has a scaly, erythematous, well demarcated rash underneath the left buttock.  The central area is quite raw.  I think this is a fungal dermatitis.  Will treat accordingly.  They will call me if his symptoms persist or do not improve or worsen.  Total time 20 minutes His updated medication list for this problem includes:    Lotrisone 1-0.05 % Crea (Clotrimazole-betamethasone) .Marland Kitchen... Apply two times a day for 14 days   Complete Medication List: 1)  Hydrochlorothiazide 25 Mg Tabs (Hydrochlorothiazide) .... One by mouth daily 2)  Potassium Chloride Crys Cr 20 Meq Tbcr (Potassium chloride crys cr) .... One by mouth three times weekly 3)  Nexium 40 Mg Cpdr (Esomeprazole magnesium) .... One by mouth deaily 4)  Fexofenadine Hcl 180 Mg Tabs (Fexofenadine hcl) .... Take 1 tablet by mouth once a day 5)  Lotrisone 1-0.05 % Crea (Clotrimazole-betamethasone) .... Apply two times a day for 14 days 6)  Mirapex 0.25 Mg Tabs (Pramipexole dihydrochloride) .... Take 1 tablet by mouth at bedtime 7)  Triamcinolone 1 %-eucerin Cream  .... Apply as needed as directed   Patient Instructions: 1)  10-14 DAYS--F/U RASH    Prescriptions: LOTRISONE 1-0.05 % CREA (CLOTRIMAZOLE-BETAMETHASONE) apply two times a day for 14 days  #30grams x 0   Entered and Authorized by:   Birdie Sons MD   Signed by:    Birdie Sons MD on 10/19/2007   Method used:   Electronically sent to ...       [facility]  [address]  [id number]       [address]       [place], [place]  [address]       Ph: [phone] or [phone]       Fax: [phone]   RxID:   [id number]  ]

## 2010-08-26 NOTE — Letter (Signed)
Summary: [facility]  [facility]   Imported By: Maryln Gottron 08/29/2009 14:27:44  _____________________________________________________________________  External Attachment:    Type:   Image     Comment:   External Document

## 2010-08-26 NOTE — Miscellaneous (Signed)
Summary: Physician's Orders/[facility]  Physician's Orders/[facility]   Imported By: Maryln Gottron 09/06/2009 13:11:27  _____________________________________________________________________  External Attachment:    Type:   Image     Comment:   External Document

## 2010-08-26 NOTE — Assessment & Plan Note (Signed)
Summary: check lump found in groin area/flu shot/cjr   Vital Signs:  Patient profile:   77 year old male BP sitting:   102 / 80  Vitals Entered By: Lynann Beaver CMA AAMA (May 26, 2010 11:24 AM) CC: mass in groin Is Patient Diabetic? No Pain Assessment Patient in pain? no        Primary Care Provider:  Birdie Sons MD  CC:  mass in groin.  History of Present Illness: pt has noted a lump right inner thigh for several weeks no trauma no pain  All other systems reviewed and were negative except for chronic fatigue, chronic decub ulcers  Current Medications (verified): 1)  Nexium 40 Mg  Cpdr (Esomeprazole Magnesium) .... One By Mouth Deaily 2)  Fexofenadine Hcl 180 Mg  Tabs (Fexofenadine Hcl) .... Take 1 Tablet By Mouth Once A Day As Needed 3)  Mirapex 0.25 Mg Tabs (Pramipexole Dihydrochloride) .... Take 1 Tablet By Mouth At Bedtime As Needed 4)  Aspirin 81 Mg Tbec (Aspirin) .... Once Daily 5)  Multivitamins  Tabs (Multiple Vitamin) .... Once Daily 6)  Nasonex 50 Mcg/act Susp (Mometasone Furoate) .... Uad 7)  Calcium-Vitamin D 600-200 Mg-Unit Tabs (Calcium-Vitamin D) .... Two Times A Day 8)  Hydrocodone-Acetaminophen 5-325 Mg Tabs (Hydrocodone-Acetaminophen) .Marland Kitchen.. 1 By Mouth Up To 4 Times Per Day As Needed For Pain 9)  Flomax 0.4 Mg Caps (Tamsulosin Hcl) .... Take 1 Tablet By Mouth Once A Day  Allergies (verified): 1)  ! Cortisone Acetate (Cortisone Acetate)  Past History:  Past Medical History: Last updated: 06/04/2009 .Current Problems:  PRURITUS (ICD-698.9) OSTEOARTHRITIS, KNEES, BILATERAL (ICD-715.96) DECUBITUS ULCER, BUTTOCK (ICD-707.05) HYPERTENSION, BENIGN (ICD-401.1) ATHEROSCLEROSIS W /INT CLAUDICATION (ICD-440.21) PACEMAKER (ICD-V45.Marland Kitchen01) AV BLOCK, COMPLETE (ICD-426.0) FUNGAL DERMATITIS (ICD-111.9) ULCER, DECUBITUS, LOWER BACK (ICD-707.03) HYPERTENSION (ICD-401.9) GERD (ICD-530.81) 1. Status post synchrony II DDD pacemaker implantation in 1996 for  AV     block, S/P generator change with [facility] DDD pacer 4/09 2. Recent onset of probable atrial fibrillation. 3. Peripheral vascular disease with reduced ABIs of 0.47 and 0.43. 4. Hypertension.  DVT, hx of  Past Surgical History: Last updated: 06/04/2009 PPM 11/18/2007  Doylene Canning. Ladona Ridgel, MD   Cataract Surgery hip fracture---2010 graft---skin to heel  Social History: Last updated: 05/16/2007 Married Never Smoked Regular exercise-no  Risk Factors: Exercise: no (05/16/2007)  Risk Factors: Smoking Status: quit > 6 months (09/02/2009)  Physical Exam  General:  alert and well-developed.   Skin:  small lipoma right inner thigh no pain to palpation   Impression & Recommendations:  Problem # 1:  LIPOMA (ICD-214.9) no treatment indicated discussed with patient and wife  Complete Medication List: 1)  Nexium 40 Mg Cpdr (Esomeprazole magnesium) .... One by mouth deaily 2)  Fexofenadine Hcl 180 Mg Tabs (Fexofenadine hcl) .... Take 1 tablet by mouth once a day as needed 3)  Mirapex 0.25 Mg Tabs (Pramipexole dihydrochloride) .... Take 1 tablet by mouth at bedtime as needed 4)  Aspirin 81 Mg Tbec (Aspirin) .... Once daily 5)  Multivitamins Tabs (Multiple vitamin) .... Once daily 6)  Nasonex 50 Mcg/act Susp (Mometasone furoate) .... Uad 7)  Calcium-vitamin D 600-200 Mg-unit Tabs (Calcium-vitamin d) .... Two times a day 8)  Hydrocodone-acetaminophen 5-325 Mg Tabs (Hydrocodone-acetaminophen) .Marland Kitchen.. 1 by mouth up to 4 times per day as needed for pain 9)  Flomax 0.4 Mg Caps (Tamsulosin hcl) .... Take 1 tablet by mouth once a day  Other Orders: Flu Vaccine 31yrs + MEDICARE PATIENTS (M8413) Administration Flu  vaccine - MCR (G0008)   Orders Added: 1)  Flu Vaccine 46yrs + MEDICARE PATIENTS [Q2039] 2)  Administration Flu vaccine - MCR [G0008] 3)  Est. Patient Level III [81191] Flu Vaccine Consent Questions     Do you have a history of severe allergic reactions to this vaccine?  no    Any prior history of allergic reactions to egg and/or gelatin? no    Do you have a sensitivity to the preservative Thimersol? no    Do you have a past history of Guillan-Barre Syndrome? no    Do you currently have an acute febrile illness? no    Have you ever had a severe reaction to latex? no    Vaccine information given and explained to patient? yes    Are you currently pregnant? no    Lot Number:[id number]   Exp Date:01/24/2011   Site Given  Left Deltoid IMration Flu vaccine - MCR [G0008]     .[email or web address]

## 2010-08-26 NOTE — Progress Notes (Signed)
Summary: request for OT eval  Phone Note Other Incoming Call back at [phone]   Caller: [facility] via vm--cecele Summary of Call: Seeing pt for PT.  Requests OT evaluation as has difficulty holding forks and spoons and would like to evaluate for adaptive utensils. Initial call taken by: Gladis Riffle, RN,  August 19, 2009 4:11 PM  Follow-up for Phone Call        ok Follow-up by: Birdie Sons MD,  August 20, 2009 9:44 AM  Additional Follow-up for Phone Call Additional follow up Details #1::        nurse notified. Additional Follow-up by: Gladis Riffle, RN,  August 20, 2009 10:17 AM

## 2010-08-26 NOTE — Medication Information (Signed)
Summary: Order for PWC GP 2 STD CAP Chair/[facility]  Order for [facility] GP 2 STD CAP Chair/[facility]   Imported By: Maryln Gottron 12/27/2009 09:46:23  _____________________________________________________________________  External Attachment:    Type:   Image     Comment:   External Document

## 2010-08-26 NOTE — Letter (Signed)
Summary: Mobility/Seating Evaluation/[facility]  Mobility/Seating Evaluation/[facility]   Imported By: Maryln Gottron 11/14/2009 13:10:07  _____________________________________________________________________  External Attachment:    Type:   Image     Comment:   External Document

## 2010-08-26 NOTE — Miscellaneous (Signed)
Summary: Physician's Orders/[facility]  Physician's Orders/[facility]   Imported By: Maryln Gottron 09/19/2009 14:37:15  _____________________________________________________________________  External Attachment:    Type:   Image     Comment:   External Document

## 2010-08-26 NOTE — Assessment & Plan Note (Signed)
Summary: to discuss getting a power chair/njr//PT WIFE R/S.Marland KitchenSLM   Vital Signs:  Patient profile:   77 year old male Weight:      149 pounds Pulse rate:   64 / minute Pulse rhythm:   irregular Resp:     12 per minute BP sitting:   122 / 80  (left arm)  Vitals Entered By: Gladis Riffle, RN (August 05, 2009 12:36 PM)   History of Present Illness: pt with svere OA unable to ambulate in the home or elsewhere due to severe OA of both knees in addition pt with subacute hip fracture that required extended  rehab stay. pt is essentially bed or chair bound he is unable to operate a manual wheelchair.  pt unable to ambulate with cane or walker  symptoms that limit ambulation: pain and uncoordination diagnosis--OA knees and fractured hip meds and other treatment: failed NSAIDS, failed walker, cane and manual wheelchari Pt. cannot walk more than 2 feet without stopping pace of ambulation: unable to ambulate pt has had multiple fallw--one resulting in hip fracutre Pt unable to use walker---pain   Preventive Screening-Counseling & Management  Alcohol-Tobacco     Smoking Status: quit > 6 months     Year Started: 1942     Year Quit: 1946  Current Problems (verified): 1)  Pressure Ulcer Buttock  (ICD-707.05) 2)  Dvt, Hx of  (ICD-V12.51) 3)  Decubitus Ulcer, Heel  (ICD-707.07) 4)  Osteoarthritis, Knees, Bilateral  (ICD-715.96) 5)  Atherosclerosis W /int Claudication  (ICD-440.21) 6)  Pacemaker  (ICD-V45.Marland Kitchen01) 7)  Av Block, Complete  (ICD-426.0) 8)  Ulcer, Decubitus, Lower Back  (ICD-707.03) 9)  Hypertension  (ICD-401.9) 10)  Gerd  (ICD-530.81)  Current Medications (verified): 1)  Nexium 40 Mg  Cpdr (Esomeprazole Magnesium) .... One By Mouth Deaily 2)  Fexofenadine Hcl 180 Mg  Tabs (Fexofenadine Hcl) .... Take 1 Tablet By Mouth Once A Day As Needed 3)  Mirapex 0.25 Mg Tabs (Pramipexole Dihydrochloride) .... Take 1 Tablet By Mouth At Bedtime As Needed 4)  Aspirin 81 Mg Tbec (Aspirin)  .... Once Daily 5)  Multivitamins  Tabs (Multiple Vitamin) .... Once Daily 6)  Astepro 137 Mcg/spray Soln (Azelastine Hcl) .Marland Kitchen.. 1-2 Sprays Once Daily As Needed 7)  Calcium-Vitamin D 600-200 Mg-Unit Tabs (Calcium-Vitamin D) .... Two Times A Day 8)  Hydrocodone-Acetaminophen 5-325 Mg Tabs (Hydrocodone-Acetaminophen) .Marland Kitchen.. 1 By Mouth Up To 4 Times Per Day As Needed For Pain  Allergies: 1)  ! Cortisone Acetate (Cortisone Acetate)  Comments:  Nurse/Medical Assistant: DISCUSS POWER WHEELCHAIR The patient's medications and allergies were reviewed with the patient and were updated in the Medication and Allergy Lists. Gladis Riffle, RN (August 05, 2009 12:39 PM)  Past History:  Past Medical History: Last updated: 06/04/2009 .Current Problems:  PRURITUS (ICD-698.9) OSTEOARTHRITIS, KNEES, BILATERAL (ICD-715.96) DECUBITUS ULCER, BUTTOCK (ICD-707.05) HYPERTENSION, BENIGN (ICD-401.1) ATHEROSCLEROSIS W /INT CLAUDICATION (ICD-440.21) PACEMAKER (ICD-V45.Marland Kitchen01) AV BLOCK, COMPLETE (ICD-426.0) FUNGAL DERMATITIS (ICD-111.9) ULCER, DECUBITUS, LOWER BACK (ICD-707.03) HYPERTENSION (ICD-401.9) GERD (ICD-530.81) 1. Status post synchrony II DDD pacemaker implantation in 1996 for AV     block, S/P generator change with [facility] DDD pacer 4/09 2. Recent onset of probable atrial fibrillation. 3. Peripheral vascular disease with reduced ABIs of 0.47 and 0.43. 4. Hypertension.  DVT, hx of  Past Surgical History: Last updated: 06/04/2009 PPM 11/18/2007  Doylene Canning. Ladona Ridgel, MD   Cataract Surgery hip fracture---2010 graft---skin to heel  Social History: Last updated: 05/16/2007 Married Never Smoked Regular exercise-no  Risk Factors: Exercise:  no (05/16/2007)  Risk Factors: Smoking Status: quit > 6 months (08/05/2009)  Review of Systems       All other systems reviewed and were negative   Physical Exam  General:  elderly man sitting in a wheelchair. Head:  normocephalic and atraumatic.     Eyes:  pupils equal and pupils round.   Neck:  Neck supple, no JVD. No masses, thyromegaly or abnormal cervical nodes. Chest Wall:  Well healed pacemaker incision. Lungs:  Clear bilaterally to auscultation and percussion. Heart:  Normal rate and regular rhythm. S1 and S2 normal without gallop, murmur, click, rub or other extra sounds. Abdomen:  Bowel sounds positive,abdomen soft and non-tender without masses, organomegaly or hernias noted. Skin:  pressure ulcer on the heel is dry without any significant induration or fluctuance.  Psych:  normally interactive and good eye contact.     Impression & Recommendations:  Problem # 1:  OSTEOARTHRITIS, KNEES, BILATERAL (ICD-715.96) more than 25 minutes discussion regarding and documenting need for mororized wheel chair. His updated medication list for this problem includes:    Aspirin 81 Mg Tbec (Aspirin) ..... Once daily    Hydrocodone-acetaminophen 5-325 Mg Tabs (Hydrocodone-acetaminophen) .Marland Kitchen... 1 by mouth up to 4 times per day as needed for pain  Problem # 2:  PRESSURE ULCER BUTTOCK (ICD-707.05) home health nurse  Problem # 3:  HYPERTENSION (ICD-401.9)  well controlled on no medications  BP today: 122/80 Prior BP: 138/80 (07/17/2009)  Labs Reviewed: K+: 4.2 (11/15/2007) Creat: : 1.1 (11/15/2007)     Problem # 4:  GERD (ICD-530.81) well controlled no medications.  His updated medication list for this problem includes:    Nexium 40 Mg Cpdr (Esomeprazole magnesium) ..... One by mouth deaily  Complete Medication List: 1)  Nexium 40 Mg Cpdr (Esomeprazole magnesium) .... One by mouth deaily 2)  Fexofenadine Hcl 180 Mg Tabs (Fexofenadine hcl) .... Take 1 tablet by mouth once a day as needed 3)  Mirapex 0.25 Mg Tabs (Pramipexole dihydrochloride) .... Take 1 tablet by mouth at bedtime as needed 4)  Aspirin 81 Mg Tbec (Aspirin) .... Once daily 5)  Multivitamins Tabs (Multiple vitamin) .... Once daily 6)  Astepro 137 Mcg/spray Soln  (Azelastine hcl) .Marland Kitchen.. 1-2 sprays once daily as needed 7)  Calcium-vitamin D 600-200 Mg-unit Tabs (Calcium-vitamin d) .... Two times a day 8)  Hydrocodone-acetaminophen 5-325 Mg Tabs (Hydrocodone-acetaminophen) .Marland Kitchen.. 1 by mouth up to 4 times per day as needed for pain  Patient Instructions: 1)  cancel next week appt 2)  see me one month  Appended Document: to discuss getting a power chair/njr//PT WIFE R/S.Marland KitchenSLM would you please make sure that he has been approved for wheelchair---motorized  Appended Document: to discuss getting a power chair/njr//PT WIFE R/S.Marland KitchenSLM wife has not heard anything yet.  [facility] to call back to let me know where we are with approval.  Appended Document: to discuss getting a power chair/njr//PT WIFE R/S.Marland KitchenSLM [facility].  They have request, but need office visit notes for 08/05/09.  These have been faxed Attn:  rehab to [phone] per casey's instructions.

## 2010-08-28 NOTE — Cardiovascular Report (Signed)
Summary: TTM   TTM   Imported By: Roderic Ovens 08/12/2010 12:12:56  _____________________________________________________________________  External Attachment:    Type:   Image     Comment:   External Document

## 2010-08-28 NOTE — Cardiovascular Report (Signed)
Summary: TTM   TTM   Imported By: Roderic Ovens 07/23/2010 15:41:30  _____________________________________________________________________  External Attachment:    Type:   Image     Comment:   External Document

## 2010-09-03 NOTE — Miscellaneous (Signed)
Summary: CORRECTED DEVICE INFORMATION  Clinical Lists Changes  Observations: Added new observation of PPMLEADSTAT2: active (08/25/2010 18:27) Added new observation of PPMLEADSER2: [id number] (08/25/2010 18:27) Added new observation of PPMLEADMOD2: [id number] (08/25/2010 18:27) Added new observation of PPMLEADDOI2: 06/11/1995 (08/25/2010 18:27) Added new observation of PPMLEADLOC2: RV (08/25/2010 18:27) Added new observation of PPMLEADSTAT1: active (08/25/2010 18:27) Added new observation of PPMLEADSER1: [id number] (08/25/2010 18:27)      PPM Specifications Following MD:  Everardo Beals. Juanda Chance, MD     PPM Vendor:  [employer]     PPM Model Number:  [id number]     PPM Serial Number:  [id number] PPM DOI:  11/18/2007     PPM Implanting MD:  Everardo Beals. Juanda Chance, MD  Lead 1    Location: RA     DOI: 06/11/1995     Model #: [id number]     Serial #: [id number]     Status: active Lead 2    Location: RV     DOI: 06/11/1995     Model #: [id number]     Serial #: [id number]     Status: active Lead 3    Location: A      Magnet Response Rate:  BOL 98.6 ERI 86.3  Indications:  TTM'S with [employer]   PPM Follow Up Pacer Dependent:  No      Parameters Mode:  DDD     Lower Rate Limit:  60     Upper Rate Limit:  100 Paced AV Delay:  150     Sensed AV Delay:  170

## 2010-10-13 LAB — POCT I-STAT, CHEM 8
BUN: 15 mg/dL (ref 6–23)
Creatinine, Ser: 0.9 mg/dL (ref 0.4–1.5)
Glucose, Bld: 217 mg/dL — ABNORMAL HIGH (ref 70–99)
Hemoglobin: 16.3 g/dL (ref 13.0–17.0)
Potassium: 3.5 mEq/L (ref 3.5–5.1)
Sodium: 138 mEq/L (ref 135–145)
TCO2: 26 mmol/L (ref 0–100)

## 2010-10-13 LAB — URINALYSIS, ROUTINE W REFLEX MICROSCOPIC
Glucose, UA: 250 mg/dL — AB
Ketones, ur: 15 mg/dL — AB
Leukocytes, UA: NEGATIVE
Nitrite: NEGATIVE
Protein, ur: 100 mg/dL — AB
pH: 7.5 (ref 5.0–8.0)

## 2010-10-13 LAB — URINE CULTURE

## 2010-10-13 LAB — URINE MICROSCOPIC-ADD ON

## 2010-10-16 ENCOUNTER — Ambulatory Visit (INDEPENDENT_AMBULATORY_CARE_PROVIDER_SITE_OTHER): Payer: Self-pay | Admitting: Internal Medicine

## 2010-10-16 ENCOUNTER — Encounter: Payer: Self-pay | Admitting: Internal Medicine

## 2010-10-16 DIAGNOSIS — Z86718 Personal history of other venous thrombosis and embolism: Secondary | ICD-10-CM

## 2010-10-16 DIAGNOSIS — I10 Essential (primary) hypertension: Secondary | ICD-10-CM

## 2010-10-16 DIAGNOSIS — L02419 Cutaneous abscess of limb, unspecified: Secondary | ICD-10-CM

## 2010-10-16 DIAGNOSIS — I70219 Atherosclerosis of native arteries of extremities with intermittent claudication, unspecified extremity: Secondary | ICD-10-CM

## 2010-10-16 DIAGNOSIS — L03116 Cellulitis of left lower limb: Secondary | ICD-10-CM

## 2010-10-16 MED ORDER — CEPHALEXIN 500 MG PO CAPS: 500.0000 mg | ORAL_CAPSULE | Freq: Three times a day (TID) | ORAL | Status: DC

## 2010-10-16 MED ORDER — FUROSEMIDE 20 MG PO TABS: 20.0000 mg | ORAL_TABLET | Freq: Every day | ORAL | Status: DC

## 2010-10-16 NOTE — Patient Instructions (Signed)
Keep legs elevated as much as possible Limit your sodium (Salt) intake   Return office visit one week Take your antibiotic as prescribed until ALL of it is gone, but stop if you develop a rash, swelling, or any side effects of the medication.  Contact our office as soon as possible if  there are side effects of the medication.

## 2010-10-16 NOTE — Progress Notes (Signed)
  Subjective:    Patient ID: [name] [name], male    DOB: 1918-04-03, 77 y.o.   MRN: [id number]  HPI   77 year old patient who has a history of   Arterial and venous insufficiency. His wife noted a crusted lesion involving his left lateral mid calf area 5 days ago. Over the past couple days there has been some additional vesicle formation as well as worsening redness and swelling. There is chronic lower extremity edema stasis changes with dry skin. He denies any fever or chills.   His wife does apply Liberal amounts of moisturizing lotions  Review of Systems  Musculoskeletal: Positive for gait problem.  Skin: Positive for color change, rash and wound.       Objective:   Physical Exam  Skin:        Examination left leg revealed a 3 cm superficial ulcer. Just inferior to this was a vesicle. There is surrounding erythema with slight excessive warmth and generally dry skin. There was +2 peripheral edema bilaterally left greater than the right          Assessment & Plan:   chronic venous insufficiency with cellulitis left lower leg. We'll attempt to elevate we'll place on antibiotic therapy local wound care discussed with the wife;  will recheck in one week

## 2010-10-21 ENCOUNTER — Encounter: Payer: Self-pay | Admitting: Internal Medicine

## 2010-10-23 ENCOUNTER — Encounter: Payer: Self-pay | Admitting: Internal Medicine

## 2010-10-23 ENCOUNTER — Ambulatory Visit (INDEPENDENT_AMBULATORY_CARE_PROVIDER_SITE_OTHER): Payer: Medicare Other | Admitting: Internal Medicine

## 2010-10-23 DIAGNOSIS — L0390 Cellulitis, unspecified: Secondary | ICD-10-CM

## 2010-10-23 MED ORDER — DOXYCYCLINE HYCLATE 100 MG PO TABS: 100.0000 mg | ORAL_TABLET | Freq: Two times a day (BID) | ORAL | Status: AC

## 2010-10-23 NOTE — Progress Notes (Signed)
  Subjective:    Patient ID: [name] [name], male    DOB: [birth year], 77 y.o.   MRN: [id number]  HPI  Reviewed dr. Charm Rings note Pt and wife state much better but still with erythema. No pain Oozing has resolved Edema much better No fever   Past Medical History  Diagnosis Date  . ATHEROSCLEROSIS W /INT CLAUDICATION 06/06/2008  . AV BLOCK, COMPLETE 06/06/2008  . BENIGN PROSTATIC HYPERTROPHY, WITH OBSTRUCTION 09/02/2009  . DVT, HX OF 06/04/2009  . GERD 05/16/2007  . HYPERTENSION 05/16/2007  . OSTEOARTHRITIS, KNEES, BILATERAL 07/12/2008  . PACEMAKER, PERMANENT 06/06/2008  . Pruritus   . Decubitus ulcer, buttock   . Decubitus ulcer, lower back   . Atrial fibrillation   . PVD (peripheral vascular disease)    Past Surgical History  Procedure Date  . Cataract extraction   . Skin graft     to heel  . Hip fracture surgery   . Pacemaker insertion     reports that he quit smoking about 66 years ago. He has never used smokeless tobacco. He reports that he does not drink alcohol or use illicit drugs. family history is not on file. Allergies  Allergen Reactions  . Cortisone Acetate     REACTION: reacted to injection in back     Review of Systems    patient denies chest pain, shortness of breath, orthopnea. Denies, abdominal pain, change in appetite, change in bowel movements. Patient denies rashes, musculoskeletal complaints. No other specific complaints in a complete review of systems.    lower extremity edema has been a chronic problem. Objective:   Physical Exam      Elderly male sitting in a wheelchair. HEENT exam atraumatic, normocephalic, The chest auscultation cardiac exam S1 and S2 are regular. Abdominal exam active bowel sounds, soft. Extremities no cyanosis or clubbing. Patient has 2+ edema on the left leg to midcalf. Peripheral pulses are normal. Neurologic exam patient has marked erythema from the ankle to the midcalf on the left.  Assessment & Plan:  Cellulitis and edema  much better  i think he will benefit from another round of antibiotics.

## 2010-10-27 ENCOUNTER — Ambulatory Visit: Payer: Self-pay | Admitting: Internal Medicine

## 2010-10-29 ENCOUNTER — Ambulatory Visit: Payer: Self-pay | Admitting: Internal Medicine

## 2010-10-30 ENCOUNTER — Ambulatory Visit (INDEPENDENT_AMBULATORY_CARE_PROVIDER_SITE_OTHER): Payer: Medicare Other | Admitting: Internal Medicine

## 2010-10-30 ENCOUNTER — Encounter: Payer: Self-pay | Admitting: Internal Medicine

## 2010-10-30 VITALS — BP 100/60 | HR 64 | Ht 71.0 in | Wt 136.0 lb

## 2010-10-30 DIAGNOSIS — I442 Atrioventricular block, complete: Secondary | ICD-10-CM

## 2010-10-30 DIAGNOSIS — Z950 Presence of cardiac pacemaker: Secondary | ICD-10-CM

## 2010-10-30 DIAGNOSIS — I10 Essential (primary) hypertension: Secondary | ICD-10-CM

## 2010-10-30 DIAGNOSIS — I4891 Unspecified atrial fibrillation: Secondary | ICD-10-CM

## 2010-10-30 NOTE — Patient Instructions (Signed)
Your physician recommends that you schedule a follow-up appointment in: one year  

## 2010-10-30 NOTE — Progress Notes (Signed)
HPI  [name] [name] returns today for followup. [name] [name] is a pleasant 77 year old man with a history of symptomatic bradycardia due to complete heart block. [name] [name] is status post permanent pacemaker insertion. The patient denies chest pain or shortness of breath. Unfortunately [name] [name] has severe arthritis and has fractured his hip in the past. Because of this [name] [name] is limited in his ambulation and sits a lot. [name] [name] essentially developed sores on his backside. The patient also has a tendency for peripheral edema. [name] [name] has had variable dose of Lasix to try and control this. [name] [name] has had no syncope.  Allergies  Allergen Reactions  . Cortisone Acetate     REACTION: reacted to injection in back     Current Outpatient Prescriptions  Medication Sig Dispense Refill  . aspirin 81 MG tablet Take 81 mg by mouth daily.        . Calcium Carbonate-Vitamin D (CALCIUM-VITAMIN D) 600-200 MG-UNIT CAPS Take 1 capsule by mouth 2 (two) times daily.       Marland Kitchen doxycycline (VIBRA-TABS) 100 MG tablet Take 1 tablet (100 mg total) by mouth 2 (two) times daily.  20 tablet  0  . fexofenadine (ALLEGRA) 180 MG tablet Take 180 mg by mouth daily.        . furosemide (LASIX) 20 MG tablet Take 1 tablet (20 mg total) by mouth daily.  30 tablet  11  . HYDROcodone-acetaminophen (NORCO) 5-325 MG per tablet Take 1 tablet by mouth every 6 (six) hours as needed.        . mometasone (NASONEX) 50 MCG/ACT nasal spray 2 sprays by Nasal route daily. As directed       . Multiple Vitamin (MULTIVITAMIN) tablet Take 1 tablet by mouth daily.        . pramipexole (MIRAPEX) 0.25 MG tablet Take 0.25 mg by mouth at bedtime as needed.        . Tamsulosin HCl (FLOMAX) 0.4 MG CAPS Take 0.4 mg by mouth daily.        Marland Kitchen esomeprazole (NEXIUM) 40 MG capsule Take 40 mg by mouth daily before breakfast.           Past Medical History  Diagnosis Date  . ATHEROSCLEROSIS W /INT CLAUDICATION 06/06/2008  . AV BLOCK, COMPLETE 06/06/2008  . BENIGN PROSTATIC HYPERTROPHY, WITH OBSTRUCTION  09/02/2009  . DVT, HX OF 06/04/2009  . GERD 05/16/2007  . HYPERTENSION 05/16/2007  . OSTEOARTHRITIS, KNEES, BILATERAL 07/12/2008  . PACEMAKER, PERMANENT 06/06/2008  . Pruritus   . Decubitus ulcer, buttock   . Decubitus ulcer, lower back   . Atrial fibrillation   . PVD (peripheral vascular disease)     ROS:   All systems reviewed and negative except as noted in the HPI.   Past Surgical History  Procedure Date  . Cataract extraction   . Skin graft     to heel  . Hip fracture surgery   . Pacemaker insertion      No family history on file.   History   Social History  . Marital Status: Married    Spouse Name: N/A    Number of Children: N/A  . Years of Education: N/A   Occupational History  . Not on file.   Social History Main Topics  . Smoking status: Former Smoker    Quit date: 07/27/1944  . Smokeless tobacco: Never Used  . Alcohol Use: No  . Drug Use: No  . Sexually Active: Not on file   Other Topics Concern  . Not on  file   Social History Narrative  . No narrative on file     BP 100/60  Pulse 64  Ht 5\' 11"  (1.803 m)  Wt 136 lb (61.689 kg)  BMI 18.97 kg/m2  Physical Exam:  Well appearing NAD HEENT: Unremarkable Neck:  No JVD, no thyromegally Lymphatics:  No adenopathy Back:  No CVA tenderness Lungs:  Clear. Well healed PPM incision. HEART:  Regular rate rhythm, no murmurs, no rubs, no clicks Abd:  Flat, positive bowel sounds, no organomegally, no rebound, no guarding Ext:  2 plus pulses, no edema, no cyanosis, no clubbing Skin:  No rashes no nodules Neuro:  CN II through XII intact, motor grossly intact  DEVICE  Normal device function.  See PaceArt for details.   Assess/Plan:

## 2010-10-30 NOTE — Assessment & Plan Note (Signed)
His ventricular response appears to be well-controlled. He is in atrial fibrillation approximately 30% of the time based on pacemaker interrogation. He is not a candidate for Coumadin therapy.

## 2010-10-30 NOTE — Assessment & Plan Note (Signed)
His current device is working normally. He does have significant atrial fibrillation. Because of frequent falls he is not a candidate for warfarin.

## 2010-11-07 ENCOUNTER — Encounter: Payer: Self-pay | Admitting: Internal Medicine

## 2010-11-07 ENCOUNTER — Ambulatory Visit (INDEPENDENT_AMBULATORY_CARE_PROVIDER_SITE_OTHER): Payer: Medicare Other | Admitting: Internal Medicine

## 2010-11-07 VITALS — BP 100/62 | HR 64 | Temp 97.4°F

## 2010-11-07 DIAGNOSIS — R600 Localized edema: Secondary | ICD-10-CM

## 2010-11-07 DIAGNOSIS — R609 Edema, unspecified: Secondary | ICD-10-CM

## 2010-11-07 MED ORDER — FUROSEMIDE 20 MG PO TABS: 20.0000 mg | ORAL_TABLET | Freq: Every day | ORAL | Status: DC

## 2010-11-07 NOTE — Assessment & Plan Note (Signed)
Needs compression devices Mechanical to start Refer home health

## 2010-11-09 NOTE — Progress Notes (Signed)
  Subjective:    Patient ID: [name] [name], male    DOB: 1917-10-01, 77 y.o.   MRN: [id number]  HPI  Complicated medial patient Has long term LE edema---multifactorial  Recently treated for cellulitis Still with erythema but edema is worse Legs are almost always below his heart No SOB nmo fever  No pain  Past Medical History  Diagnosis Date  . ATHEROSCLEROSIS W /INT CLAUDICATION 06/06/2008  . AV BLOCK, COMPLETE 06/06/2008  . BENIGN PROSTATIC HYPERTROPHY, WITH OBSTRUCTION 09/02/2009  . DVT, HX OF 06/04/2009  . GERD 05/16/2007  . HYPERTENSION 05/16/2007  . OSTEOARTHRITIS, KNEES, BILATERAL 07/12/2008  . PACEMAKER, PERMANENT 06/06/2008  . Pruritus   . Decubitus ulcer, buttock   . Decubitus ulcer, lower back   . Atrial fibrillation   . PVD (peripheral vascular disease)    Past Surgical History  Procedure Date  . Cataract extraction   . Skin graft     to heel  . Hip fracture surgery   . Pacemaker insertion     reports that he quit smoking about 66 years ago. He has never used smokeless tobacco. He reports that he does not drink alcohol or use illicit drugs. family history is not on file. Allergies  Allergen Reactions  . Cortisone Acetate     REACTION: reacted to injection in back     Review of Systems  patient denies chest pain, shortness of breath, orthopnea. Denies lower extremity edema, abdominal pain, change in appetite, change in bowel movements. Patient denies rashes, musculoskeletal complaints. No other specific complaints in a complete review of systems.      Objective:   Physical Exam  elderly male in no acute distress, sittting in wheelchair. HEENT exam atraumatic, normocephalic, neck supple without jugular venous distention. Chest clear to auscultation cardiac exam S1-S2 are regular. Abdominal exam overweight with bowel sounds, soft and nontender. Extremities 3+ edema. Neurologic exam is alert , not walking        Assessment & Plan:

## 2010-11-12 ENCOUNTER — Telehealth: Payer: Self-pay | Admitting: *Deleted

## 2010-11-12 NOTE — Telephone Encounter (Signed)
Orders are unclear does Dr Cato Mulligan want compression stockings or una boots?  Please clarify

## 2010-11-13 NOTE — Telephone Encounter (Signed)
L/m on [name] [name]'s voicemail with Dr Cato Mulligan instructions

## 2010-11-13 NOTE — Telephone Encounter (Signed)
Treatment for lymphedema to include sequential compression devices. Once the edema has improved or resolved he should then use compression stockings.

## 2010-11-14 ENCOUNTER — Telehealth: Payer: Self-pay | Admitting: *Deleted

## 2010-11-14 DIAGNOSIS — B379 Candidiasis, unspecified: Secondary | ICD-10-CM

## 2010-11-14 MED ORDER — FLUCONAZOLE 100 MG PO TABS: 100.0000 mg | ORAL_TABLET | Freq: Every day | ORAL | Status: AC

## 2010-11-14 MED ORDER — NYSTATIN 100000 UNIT/GM EX POWD: CUTANEOUS | Status: DC

## 2010-11-14 NOTE — Telephone Encounter (Signed)
Nystatin powder use bid Nystatin ointment use bid Diflucan 100mg  po bid for 10 days

## 2010-11-14 NOTE — Telephone Encounter (Signed)
Meds called in to [facility])

## 2010-11-14 NOTE — Telephone Encounter (Signed)
Nurse Olegario Messier) is asking for treatment for what she feels is an extensive case of yeast on pt's buttocks, thighs, and trunk.  Bright red, itchy, painful.  Would like to use Nystatin  Powder, ointment and Diflucan. [facility]

## 2010-11-20 ENCOUNTER — Encounter: Payer: Self-pay | Admitting: Internal Medicine

## 2010-11-20 ENCOUNTER — Ambulatory Visit (INDEPENDENT_AMBULATORY_CARE_PROVIDER_SITE_OTHER): Payer: Medicare Other | Admitting: Internal Medicine

## 2010-11-20 DIAGNOSIS — I10 Essential (primary) hypertension: Secondary | ICD-10-CM

## 2010-11-20 DIAGNOSIS — R609 Edema, unspecified: Secondary | ICD-10-CM

## 2010-11-20 DIAGNOSIS — R600 Localized edema: Secondary | ICD-10-CM

## 2010-11-20 DIAGNOSIS — I4891 Unspecified atrial fibrillation: Secondary | ICD-10-CM

## 2010-11-20 NOTE — Patient Instructions (Signed)
Limit your sodium (Salt) intake  Keep legs elevated as much as possible  Local skin care as discussed  Call or return to clinic prn if these symptoms worsen or fail to improve as anticipated.

## 2010-11-20 NOTE — Progress Notes (Signed)
  Subjective:    Patient ID: [name] [name], male    DOB: [birth year], 77 y.o.   MRN: [id number]  HPI  77 year old patient who has a history of chronic venous insufficiency and remote history of DVT. He was seen earlier for cellulitis involving his left lower lateral leg. A recently has had a slight increase in swelling. There's been no local tenderness pain or drainage. Denies any fever or chills. He has hypertension which has been well-controlled. He is somewhat worried about recurrent cellulitis    Review of Systems  Cardiovascular: Positive for leg swelling.  Skin: Positive for rash and wound.       Objective:   Physical Exam  Constitutional: He is oriented to person, place, and time. He appears well-developed.  HENT:  Head: Normocephalic.  Right Ear: External ear normal.  Left Ear: External ear normal.  Eyes: Conjunctivae and EOM are normal.  Neck: Normal range of motion.  Cardiovascular: Normal rate and normal heart sounds.   Pulmonary/Chest: Breath sounds normal.  Abdominal: Bowel sounds are normal.  Musculoskeletal: Normal range of motion. He exhibits edema. He exhibits no tenderness.       Examination left leg revealed some soft tissue swelling involving the left lateral mid leg. There is no fluctuance tenderness. There are 2 very small dry crusted lesions that did not appear to be inflamed. There was stasis hyperpigmentation and some scaling  Neurological: He is alert and oriented to person, place, and time.  Psychiatric: He has a normal mood and affect. His behavior is normal.          Assessment & Plan:   Chronic venous insufficiency. No active ulceration or evidence of synovitis at this time Chronic lower extremity edema  The patient will attempt to keep his legs elevated as much as possible. Local skin care discussed. The thought restricted diet encouraged

## 2010-11-26 ENCOUNTER — Telehealth: Payer: Self-pay | Admitting: *Deleted

## 2010-11-26 NOTE — Telephone Encounter (Signed)
Home Health would like to use [employer] on pt. And evaluate and treat sores on buttocks, and skin tears.

## 2010-11-26 NOTE — Telephone Encounter (Signed)
ok 

## 2010-11-27 NOTE — Telephone Encounter (Signed)
Order given to Home Health

## 2010-12-08 ENCOUNTER — Telehealth: Payer: Self-pay | Admitting: *Deleted

## 2010-12-08 NOTE — Telephone Encounter (Signed)
Nurse states pt. Is doing much better.  Did not have to use the [employer].  She thinks he is allergic to Silicone, and has stopped using it.

## 2010-12-09 NOTE — Assessment & Plan Note (Signed)
[facility]                         ELECTROPHYSIOLOGY OFFICE NOTE   NAME:[name] [name], [name] [name]                       MRN:          [id number]  DATE:11/15/2007                            DOB:          [birth year]    [name] returns today for followup.  He is a patient Dr. Regino Schultze with  a history of symptomatic bradycardia, complete heart block status post  pacemaker insertion, who also has a history of hypertension.  He  underwent pacemaker insertion initially in 1996 by Dr. Royetta Asal for  complete heart block.  The patient returns today for followup.  He has  been stable.  He does have peripheral vascular disease, which are which  is not bothering him presently.  He is scheduled to go back to [place]  in several weeks and is here today because his pacemaker has reached  ERI.  He denies syncope or other symptoms.  His medications with HCTZ 25  a day, multiple vitamins, aspirin 81 mg a day, and potassium  supplements.  Interrogation of his pacemaker demonstrates a battery  voltage of 2.4 volts.  He has no underlying escape rhythm.   IMPRESSION:  1. She is complete heart block.  2. Status post pacemaker, now way beyond ERI.  3. Peripheral vascular disease.  4. Hypertension.   DISCUSSION:  I have a recommended pacemaker generator change, will  schedule this the next couple of days.     Doylene Canning. Ladona Ridgel, MD  Electronically Signed    GWT/MedQ  DD: 11/15/2007  DT: 11/15/2007  Job #: [id number]   cc:   Everardo Beals. Juanda Chance, MD, [facility]  Bruce H. Swords, MD

## 2010-12-09 NOTE — Assessment & Plan Note (Signed)
[facility]                            CARDIOLOGY OFFICE NOTE   NAME:[name] [name], [name] [name]                       MRN:          [id number]  DATE:06/07/2008                            DOB:          09/22/1917    PRIMARY CARE PHYSICIAN:  [name] Mole. Swords, MD   CLINICAL HISTORY:  [name] is 77 years old and returned for management  of his pacemaker.  He had a Synchrony II DDD pacemaker implanted in 1996  for complete AV block by Dr. Royetta Asal and recently reached ERI.  Dr.  Ladona Ridgel put in a new generator and implanted a St. Jude Zephyr DDD  pacemaker.  He has done fine since that time.  He has had no recent  chest pain, shortness of breath, or palpitations.   PAST MEDICAL HISTORY:  Significant for hypertension and Barrett  esophagus.   CURRENT MEDICATIONS:  1. Hydrochlorothiazide 25 mg daily.  2. Nexium.  3. Potassium 20 mEq 3 tablets a week.  4. Aspirin 81 mg a day.   PAST MEDICAL HISTORY:  Also significant for paroxysmal atrial  fibrillation detected on his pacemaker, but for which we decided not to  treat him with Coumadin.   PHYSICAL EXAMINATION:  VITAL SIGNS:  The blood pressure is 112/70 and  pulse 76 and regular.  NECK:  There was no venous distention.  The carotid pulses were full  without bruits.  CHEST:  Clear.  CARDIAC:  Rhythm was regular.  There were no murmurs or gallops.  ABDOMEN:  Soft with normal bowel sounds.  EXTREMITIES:  There was trace peripheral edema.   IMPRESSION:  1. Status post recent generator change with a St. Jude Zephyr      generator for initial implantation in 1996 for atrioventricular      block.  2. Frequent short mode switches, but less than 1% of the time in mode      switch.  3. Peripheral vascular disease with reduced ankle-brachial indices of      0.47 and 0.43.  4. Hypertension.   RECOMMENDATIONS:  [name] is doing well.  We made a decision not to put  him on Coumadin for the mode switches.  They are  mostly short lived.  I  plan to see him back in followup in the year anniversary of his  generator change, which would be in April.     Bruce Elvera Lennox Juanda Chance, MD, [facility]  Electronically Signed    BRB/MedQ  DD: 06/07/2008  DT: 06/08/2008  Job #: [id number]

## 2010-12-09 NOTE — Assessment & Plan Note (Signed)
[facility]                            CARDIOLOGY OFFICE NOTE   NAME:[name] [name], [name] [name]                       MRN:          [id number]  DATE:10/13/2007                            DOB:          03/03/1918    PRIMARY CARE PHYSICIAN:  Valetta Mole. Swords, MD   [name] [name] is 77 years old and returns for management of his pacemaker.  He had a synchrony II DDD pacemaker implanted in 1996 for a complete AV  block by Dr. Areta Haber.  He has done well since that time, has had no  recent chest pain, shortness breath or palpitations.   PAST MEDICAL HISTORY:  Significant for hypertension, Barrett's  esophagus.  Past history is also significant for an ulceration in his  foot which is being treated by Dr. Suzette Battiest, a podiatrist.   CURRENT MEDICATIONS:  Include hydrochlorothiazide which has been used  for peripheral edema related to venous insufficiency.  He also takes  multivitamins and potassium 20 mEq three times a week.   EXAMINATION:  VITAL SIGNS:  On examination, blood pressure is 92/73 and  pulse 88 and regular.  NECK:  There is no venous distension.  Carotid pulses were full without  bruits.  CHEST:  Clear without rales or rhonchi.  HEART:  Rhythm was irregular.  Heart sounds were normal.  ABDOMEN:  The abdomen was soft with normal bowel sounds.  No  hepatosplenomegaly.  The right lower extremity was wrapped with a Ace-  like bandage.  There was 1+ edema in the left lower extremity.   We interrogated the pacemaker, and it appeared that his underlying  rhythm was atrial fibrillation.  We could not tell for sure that we were  able to achieve any pacing.  Also, has his battery voltage was 2.5,  which is close to ERI.   The threshold ventricular lead was good.   IMPRESSION:  1. Status post synchrony II DDD pacemaker implantation in 1996 for AV      block, now stable.  2. Recent onset of probable atrial fibrillation.  3. Near ERI for his current pacemaker.  4.  Peripheral vascular disease with reduced ABIs of 0.47 and 0.43.  5. Hypertension.   RECOMMENDATIONS:  [name] [name] is near ERI for his generator change.  His  and his wife have been 6 months hear and 6 months in [place] and is  scheduled to leave for [place] in the end of April.  We would like to  get a generator replaced here if possible.  We turned his output up, and  hopefully we can trigger ERI and plan to have him come back at the end  of April before he is ready to leave, to see if it is time for  replacement of his pacemaker.  If he is not ready, then we will arrange  for him to have bone checks about every month and half after that, while  he is in [place], and if he reaches ERI, then he can come back and have  his generator placed.  He also had new-onset atrial fibrillation.  I  will start him on aspirin 81 mg a day for this now, will make a more  definitive decision after we replace his pacemaker, and if we document  with certainly that he is indeed atrial fib.     Bruce Elvera Lennox Juanda Chance, MD, [facility]  Electronically Signed    BRB/MedQ  DD: 10/13/2007  DT: 10/13/2007  Job #: [id number]

## 2010-12-09 NOTE — Telephone Encounter (Signed)
Dr. Cato Mulligan made aware.

## 2010-12-09 NOTE — Discharge Summary (Signed)
NAME:  [name] [name], [name] [name] NO.:  [id number]   MEDICAL RECORD NO.:  [id number]          PATIENT TYPE:  OIB   LOCATION:  NA                           FACILITY:  [facility]   PHYSICIAN:  Doylene Canning. Ladona Ridgel, MD    DATE OF BIRTH:  12/16/1917   DATE OF ADMISSION:  11/18/2007  DATE OF DISCHARGE:  11/18/2007                               DISCHARGE SUMMARY   This patient has allergy to cortisone.   Dictation greater than 25 minutes.   FINAL DIAGNOSES:  1. Discharging day of explantation of St. Jude Synchrony II dual-      chamber pacemaker, which is at end-of-life with implantation of a      St. Jude Zephyr XL DR dual chamber pacemaker by Dr. Lewayne Bunting.  2. A completed heart block.  3. Pacemaker inserted 1996.   SECONDARY DIAGNOSES:  1. Hypertension.  2. Peripheral vascular disease.  3. Hypertension.   PROCEDURE:  1. November 18, 2007, explantation of existing St. Jude Synchrony II      permanent pacemaker which is at, ERI  2. November 18, 2007, implant of St. Jude Zephyr XL DR dual-chamber      pacemaker by Dr. Lewayne Bunting.  The patient is instructed to remove      the bandage on Saturday, November 19, 2007, and leave the incision      open to the air.  He is to keep his incision dry for next 7 days,      to sponge bathe until Friday, Nov 25, 2007.  He discharges on his      pre-admission medications plus antibiotic.   DISCHARGE MEDICATIONS:  1. Hydrochlorothiazide.  2. Nexium 40 mg daily.  3. Allegra 180 mg daily.  4. Multivitamin daily.  5. Potassium chloride 20 mEq 3 tablets weekly.  6. Enteric-coated aspirin 81 mg daily.  7. Keflex 250 mg 1 tablet one-half hour before breakfast, lunch,      dinner, at bedtime for 5 days.   BRIEF HISTORY:  [name] [name] is a 77 year old man.  He has a history of  symptomatic bradycardia and complete heart block.  He underwent  implantation of a St. Jude Synchrony II pacemaker in 1996 it is now ERI.   The patient has been stable.  He has  peripheral vascular disease but he  is not being bothered by that.  He denies syncope or other symptoms.  Interrogation of the pacemaker shows that it is at ERI.  We will have  the pacemaker generator exchanged in the next couple of days.   HOSPITAL COURSE:  The patient presents electively on November 18, 2007, he  underwent a seamless transfer of the existing pacemaker with replacement  with a St. Jude Zephyr dual-chamber pacemaker by Dr. Ladona Ridgel.  He goes  home with Keflex 250 mg tablets 4 times daily for a 5-day course.   LABORATORY STUDIES:  Pertinent to this admission:  Complete blood count;  white cell 7.1, hemoglobin 16.1, hematocrit 47.7, platelets 232, protime  11.1, and INR 0.9.  Sodium 141, potassium 4.2, chloride 101, carbonate  34, glucose  124, BUN 17, and creatinine 1.1.  This ends my week actually  and I am so glad.      [name] Mirza, PA      Doylene Canning. Ladona Ridgel, MD  Electronically Signed    GM/MEDQ  D:  11/18/2007  T:  11/19/2007  Job:  [id number]   cc:   [name] Mole. Cato Mulligan, MD  Doylene Canning. Ladona Ridgel, MD

## 2010-12-09 NOTE — Op Note (Signed)
NAME:  CHAIS, FEHRINGER NO.:  [id number]   MEDICAL RECORD NO.:  [id number]          PATIENT TYPE:  OIB   LOCATION:  NA                           FACILITY:  [facility]   PHYSICIAN:  Doylene Canning. Ladona Ridgel, MD    DATE OF BIRTH:  [birth year]   DATE OF PROCEDURE:  11/18/2007  DATE OF DISCHARGE:                               OPERATIVE REPORT   PROCEDURE PERFORMED:  Removal of a previously implanted dual-chamber  pacemaker and insertion of a new dual-chamber device secondary to the  initial device being at ERI.   INTRODUCTION:  The patient is an 77 year old male with a history of  symptomatic bradycardia status post pacemaker insertion in the past who  has risk of ERI in his pacemaker and is now referred for pacemaker  generator removal and insertion of a new device.   PROCEDURE:  After informed consent was obtained, the patient was taken  to the diagnostic catheterization lab in fasting state.  After usual  preparation and draping, 30 mL of lidocaine was infiltrated in the left  infraclavicular region of the old pacemaker insertion site.  A 5-cm  incision was carried out over this region.  Electrocautery was utilized  to dissect down and enter the pacemaker pocket.  The pacemaker was  removed with gentle traction.  The old St. Jude Synchrony II was removed  from the pocket and removed from the atrial and ventricular leads.  The  new St. Jude Zephyr XL DR, model 5826 dual-chamber pacemaker, serial  number [id number] was connected to the atrial and ventricular leads and  placed back in the subcutaneous pocket.  The pocket was irrigated with  kanamycin.  Electrocautery was utilized to assure hemostasis and the  incision closed with 2-0 Vicryl followed by a layer of 4-0 Vicryl.  Benzoin was painted on the skin.  Steri-Strips were applied and  pressures were placed and the patient was returned to his room in  satisfactory condition.   COMPLICATIONS:  There were no immediate procedure  complications.   RESULTS:  This demonstrated a successful removal of a previously  implanted dual-chamber pacemaker and insertion of a new device without  immediate procedure complication.      Doylene Canning. Ladona Ridgel, MD  Electronically Signed     GWT/MEDQ  D:  11/18/2007  T:  11/19/2007  Job:  [id number]   cc:   Everardo Beals. Juanda Chance, MD, [facility]

## 2010-12-11 DIAGNOSIS — R609 Edema, unspecified: Secondary | ICD-10-CM

## 2010-12-11 DIAGNOSIS — L03119 Cellulitis of unspecified part of limb: Secondary | ICD-10-CM

## 2010-12-11 DIAGNOSIS — M6281 Muscle weakness (generalized): Secondary | ICD-10-CM

## 2010-12-11 DIAGNOSIS — R279 Unspecified lack of coordination: Secondary | ICD-10-CM

## 2010-12-11 DIAGNOSIS — L02419 Cutaneous abscess of limb, unspecified: Secondary | ICD-10-CM

## 2010-12-12 NOTE — Assessment & Plan Note (Signed)
[facility]                            CARDIOLOGY OFFICE NOTE   NAME:[name] [name] SIMMERS                       MRN:          [id number]  DATE:09/15/2006                            DOB:          [birth year]    PRIMARY CARE PHYSICIAN:  Dr. Birdie Sons.   CLINICAL COURSE:  [name] [name] is 77 years old and has a Synchrony 2 DDD  pacemaker implanted in 1996 for a complete AV block by Dr. Royetta Asal. He  has done well from a heart standpoint and has had no recent chest pain,  shortness of breath or palpitations.   His biggest complaint is related to pain in his left leg and jumping at  night of his left leg and swelling of both legs. His wife also noticed  discoloration in both legs. He saw a podiatrist and had Doppler studies  done at [facility] which did show peripheral vascular disease. His  ABIs were good but his TBIs were 0.47 and 0.43.   PAST MEDICAL HISTORY:  Significant for hypertension and Barrett's  esophagus.   CURRENT MEDICATIONS:  1. Hydrochlorothiazide 25 mg a day.  2. Multivitamin.  3. Potassium.  4. Fexofenadine.   PHYSICAL EXAMINATION:  VITAL SIGNS:  Blood pressure is 132/70 and the  pulse 60 and regular.  NECK:  There was no vein distention. Carotid pulses were full without  bruits.  CHEST:  Clear without rales or rhonchi.  HEART:  Rhythm was regular. Heart sounds were normal. No murmurs or  gallops.  ABDOMEN:  Soft without organomegaly.  EXTREMITIES:  There was 2+ edema on the left and 1+ edema on the right.  There was dependent rubor. I had difficulty feeling pedal pulses.   Electrocardiogram showed atrial tracking and ventricular pacing.  Interrogation of his pacemaker showed good thresholds and good lead  impedance on both leads with about 18 months left on his pacemaker  battery. He is pacer dependent.   IMPRESSION:  1. Status post Synchrony 2 DDD pacemaker implantation for AV block      with good pacer function - pacer  dependent.  2. Edema lower extremities probably related to venous insufficiency.  3. Peripheral vascular disease with decreased indices lower      extremities.  4. Hypertension.  5. Barrett's esophagus.   RECOMMENDATIONS:  [name] [name] is doing fine with his pacemaker. He is  having a lot of pain in his left leg but I do not think it is ischemic  pain. He may have restless leg syndrome. He appears to have some venous  insufficiency. We will plan to get some venous Dopplers and I will  switch from hydrochlorothiazide to Lasix 40 mg which will be stronger.  We will get a BNP when he comes in for his venous Dopplers. I also  encouraged him to keep his legs elevated. If he does not show a good  response to this then we can add support hose depending on the results  of his Dopplers. I will plan to see him back for a pacemaker followup in  a year. If his left pain symptoms  continue, I suggest he see Dr. Cato Mulligan  and decide about further evaluation. I do not think this is related to  his arterial or venous disease.     Bruce Elvera Lennox Juanda Chance, MD, [facility]  Electronically Signed    BRB/MedQ  DD: 09/15/2006  DT: 09/15/2006  Job #: [id number]

## 2010-12-31 ENCOUNTER — Ambulatory Visit (INDEPENDENT_AMBULATORY_CARE_PROVIDER_SITE_OTHER): Payer: Medicare Other | Admitting: Internal Medicine

## 2010-12-31 ENCOUNTER — Encounter: Payer: Self-pay | Admitting: Internal Medicine

## 2010-12-31 DIAGNOSIS — R609 Edema, unspecified: Secondary | ICD-10-CM

## 2010-12-31 DIAGNOSIS — I10 Essential (primary) hypertension: Secondary | ICD-10-CM

## 2010-12-31 DIAGNOSIS — I4891 Unspecified atrial fibrillation: Secondary | ICD-10-CM

## 2010-12-31 DIAGNOSIS — M1710 Unilateral primary osteoarthritis, unspecified knee: Secondary | ICD-10-CM

## 2010-12-31 DIAGNOSIS — R600 Localized edema: Secondary | ICD-10-CM

## 2010-12-31 LAB — CBC WITH DIFFERENTIAL/PLATELET
Basophils Relative: 0.4 % (ref 0.0–3.0)
Eosinophils Relative: 3.8 % (ref 0.0–5.0)
Lymphocytes Relative: 19 % (ref 12.0–46.0)
MCHC: 34.1 g/dL (ref 30.0–36.0)
Monocytes Relative: 6.9 % (ref 3.0–12.0)
Neutro Abs: 4 10*3/uL (ref 1.4–7.7)
WBC: 5.8 10*3/uL (ref 4.5–10.5)

## 2010-12-31 LAB — BASIC METABOLIC PANEL
BUN: 23 mg/dL (ref 6–23)
Chloride: 101 mEq/L (ref 96–112)
Creatinine, Ser: 1.1 mg/dL (ref 0.4–1.5)
Glucose, Bld: 97 mg/dL (ref 70–99)
Potassium: 3.9 mEq/L (ref 3.5–5.1)

## 2010-12-31 NOTE — Progress Notes (Signed)
  Subjective:    Patient ID: [name] [name], male    DOB: [birth year], 77 y.o.   MRN: [id number]  HPI  F/u Pt with multiple medical problems OA of knees is main problem---can get around house but frequently uses wheelchair  GERD--no sxs. Appetite is good  AFIB/AV block--status post pacemaker.  History of decubitus ulcer, wife states much improved.  Past Medical History  Diagnosis Date  . ATHEROSCLEROSIS W /INT CLAUDICATION 06/06/2008  . AV BLOCK, COMPLETE 06/06/2008  . BENIGN PROSTATIC HYPERTROPHY, WITH OBSTRUCTION 09/02/2009  . DVT, HX OF 06/04/2009  . GERD 05/16/2007  . HYPERTENSION 05/16/2007  . OSTEOARTHRITIS, KNEES, BILATERAL 07/12/2008  . PACEMAKER, PERMANENT 06/06/2008  . Pruritus   . Decubitus ulcer, buttock   . Decubitus ulcer, lower back   . Atrial fibrillation   . PVD (peripheral vascular disease)    Past Surgical History  Procedure Date  . Cataract extraction   . Skin graft     to heel  . Hip fracture surgery   . Pacemaker insertion     reports that he quit smoking about 66 years ago. He has never used smokeless tobacco. He reports that he does not drink alcohol or use illicit drugs. family history is not on file. Allergies  Allergen Reactions  . Cortisone Acetate     REACTION: reacted to injection in back      Review of Systems  patient denies chest pain, shortness of breath, orthopnea. Denies lower extremity edema, abdominal pain, change in appetite, change in bowel movements. Patient denies rashes, musculoskeletal complaints. No other specific complaints in a complete review of systems.      Objective:   Physical Exam Elderly male, sitting in a wheelchair  in no acute distress. HEENT exam atraumatic, normocephalic, neck supple without lymphadenopathy or jugular venous distention. Chest is clear to auscultation without increased work of breathing. Cardiac exam S1 and S2 are regular. Abdominal exam active bowel sounds, soft, nontender. Extremities there is  2+, dependent edema in the lower sternum is bilaterally without significant erythema. There are changes of chronic venous insufficiency. Neurologic exam he is alert and oriented. Dermatologic exam sacrum and rectum evaluated. Decubitus ulcer is essentially completely healed.       Assessment & Plan:

## 2011-01-09 NOTE — Assessment & Plan Note (Signed)
Chronic lower extremity edema likely secondary to venous insufficiency and dependency. He does have a history of DVT but no acute changes. I don't think any further evaluation necessary.

## 2011-01-09 NOTE — Assessment & Plan Note (Signed)
Patient does have physical therapy a few times weekly. This seems to help. He is ambulating some in home.

## 2011-01-09 NOTE — Assessment & Plan Note (Signed)
Adequate control and currently on no medications.

## 2011-01-29 ENCOUNTER — Encounter: Payer: Self-pay | Admitting: Internal Medicine

## 2011-01-29 DIAGNOSIS — I442 Atrioventricular block, complete: Secondary | ICD-10-CM

## 2011-03-05 ENCOUNTER — Telehealth: Payer: Self-pay | Admitting: *Deleted

## 2011-03-05 NOTE — Telephone Encounter (Signed)
[name] [name], PT needs a verbal order give for physical therapy for 6wks twice a week.  Verbal order given

## 2011-04-28 ENCOUNTER — Telehealth: Payer: Self-pay | Admitting: *Deleted

## 2011-04-28 NOTE — Telephone Encounter (Signed)
[name] [name] at [facility] PT would like an order for 2 more weeks of therapy for pt's increased right shoulder pain.

## 2011-04-30 ENCOUNTER — Encounter: Payer: Self-pay | Admitting: Internal Medicine

## 2011-04-30 DIAGNOSIS — I442 Atrioventricular block, complete: Secondary | ICD-10-CM

## 2011-04-30 NOTE — Telephone Encounter (Signed)
Left message on Home Health voice for [facility].

## 2011-04-30 NOTE — Telephone Encounter (Signed)
ok 

## 2011-05-12 ENCOUNTER — Other Ambulatory Visit: Payer: Self-pay | Admitting: Internal Medicine

## 2011-05-12 NOTE — Telephone Encounter (Addendum)
Pt wife is requesting something for itching call into [facility] [address] [phone].

## 2011-05-12 NOTE — Telephone Encounter (Signed)
pls advise

## 2011-05-13 NOTE — Telephone Encounter (Signed)
Try eucerin cream to skin twice daily  Atarax 2 mg po twice daily #30/1 refill

## 2011-05-14 MED ORDER — HYDROXYZINE HCL 25 MG PO TABS: 25.0000 mg | ORAL_TABLET | Freq: Two times a day (BID) | ORAL | Status: AC

## 2011-05-14 MED ORDER — CARRINGTON MOISTURE BARRIER EX CREA: TOPICAL_CREAM | CUTANEOUS | Status: DC | PRN

## 2011-05-14 NOTE — Telephone Encounter (Signed)
See note

## 2011-05-14 NOTE — Telephone Encounter (Signed)
rx sent in electronically, pt aware 

## 2011-06-08 ENCOUNTER — Telehealth: Payer: Self-pay | Admitting: *Deleted

## 2011-06-08 NOTE — Telephone Encounter (Signed)
Left message on RN's voice mail.

## 2011-06-08 NOTE — Telephone Encounter (Signed)
Try silvadene

## 2011-06-08 NOTE — Telephone Encounter (Signed)
Carollee Herter, RN [facility] care calls and said they were dressing pt's wound with Zinc oxide XS and it did not help.  So they changed to Methalax?   That helped but it was causing pt pain.  Wants to know what Dr Cato Mulligan recommends

## 2011-06-22 ENCOUNTER — Telehealth: Payer: Self-pay | Admitting: Internal Medicine

## 2011-06-22 NOTE — Telephone Encounter (Signed)
Would like to continue Physical therapy with patient for 3 more week (1 time per week for 3 more weeks). Please contact ken

## 2011-06-23 NOTE — Telephone Encounter (Signed)
Ok per Dr Cato Mulligan, verbal orders given to Physical Therapist

## 2011-07-03 ENCOUNTER — Telehealth: Payer: Self-pay | Admitting: Internal Medicine

## 2011-07-03 NOTE — Telephone Encounter (Signed)
[name] [name] with [facility] called and said that she is re-certing pt for home health nursing to re-assess wound on pts bottom and leg. Orders are been sent for physcians signature. Pt has been sch an ov to see Dr Amador Cunas on 07/06/11 at 10am, re: ?cellulitis in lft lower extremety/red/non-pitting edema/cool to touch/per [facility]/pt req to see any pt if pcp not avail.

## 2011-07-06 ENCOUNTER — Encounter: Payer: Self-pay | Admitting: Internal Medicine

## 2011-07-06 ENCOUNTER — Ambulatory Visit (INDEPENDENT_AMBULATORY_CARE_PROVIDER_SITE_OTHER): Payer: Medicare Other | Admitting: Internal Medicine

## 2011-07-06 DIAGNOSIS — L89159 Pressure ulcer of sacral region, unspecified stage: Secondary | ICD-10-CM

## 2011-07-06 DIAGNOSIS — M1710 Unilateral primary osteoarthritis, unspecified knee: Secondary | ICD-10-CM

## 2011-07-06 DIAGNOSIS — R600 Localized edema: Secondary | ICD-10-CM

## 2011-07-06 DIAGNOSIS — L89109 Pressure ulcer of unspecified part of back, unspecified stage: Secondary | ICD-10-CM

## 2011-07-06 DIAGNOSIS — I4891 Unspecified atrial fibrillation: Secondary | ICD-10-CM

## 2011-07-06 DIAGNOSIS — I10 Essential (primary) hypertension: Secondary | ICD-10-CM

## 2011-07-06 DIAGNOSIS — R609 Edema, unspecified: Secondary | ICD-10-CM

## 2011-07-06 DIAGNOSIS — L8990 Pressure ulcer of unspecified site, unspecified stage: Secondary | ICD-10-CM

## 2011-07-06 NOTE — Patient Instructions (Signed)
Limit your sodium (Salt) intake  Wound Care clinic as discussed  Attempt to keep her legs elevated as much as possible  Frequent side to side turning to minimize pressure on your sacral region  Continue present local wound care

## 2011-07-06 NOTE — Progress Notes (Signed)
  Subjective:    Patient ID: [name] [name], male    DOB: 06/22/1918, 77 y.o.   MRN: [id number]  HPI 77 year old patient who is nonambulatory he has a history of arthritis and remote history of a left hip fracture. He states that he has been dealing with peripheral edema as well as a sacral decubitus over the past 3 years. He is followed weekly by home health care who are treating his sacral decubitus aggressively area and the wife feels that the decubitus is worsening however. He sustained an abrasion involving his anterior lower leg and the wife was concerned about infection. He has chronic lower extremity edema as well as atrial fibrillation status post permanent pacemaker insertion for complete heart block. He has treated hypertension     Review of Systems  Cardiovascular: Positive for leg swelling.  Skin: Positive for rash and wound.       Objective:   Physical Exam  Constitutional: He appears well-developed and well-nourished. No distress.       Requires two-person assistance to stand  Cardiovascular: Normal rate.   Pulmonary/Chest: Effort normal.  Skin:       Swelling and erythema involving the left lower extremity distal to the mid calf area. Just proximal he has a healing abrasion with a crust that appeared clean and noninfected  The sacral area was examined and was coated heavily with a petroleum based cream.  Psychiatric: He has a normal mood and affect. His behavior is normal.          Assessment & Plan:  Chronic sacral decubitus. We'll set up a visit with the wound clinic to further optimize his care. We'll continue home health care  Lower extremity edema. This appears chronic and unchanged. No evidence of infection Hypertension Osteoarthritis status post left hip fracture

## 2011-07-16 ENCOUNTER — Encounter (HOSPITAL_BASED_OUTPATIENT_CLINIC_OR_DEPARTMENT_OTHER): Payer: Medicare Other | Attending: Internal Medicine

## 2011-07-16 DIAGNOSIS — K219 Gastro-esophageal reflux disease without esophagitis: Secondary | ICD-10-CM | POA: Insufficient documentation

## 2011-07-16 DIAGNOSIS — L8990 Pressure ulcer of unspecified site, unspecified stage: Secondary | ICD-10-CM | POA: Insufficient documentation

## 2011-07-16 DIAGNOSIS — I10 Essential (primary) hypertension: Secondary | ICD-10-CM | POA: Insufficient documentation

## 2011-07-16 DIAGNOSIS — Z7982 Long term (current) use of aspirin: Secondary | ICD-10-CM | POA: Insufficient documentation

## 2011-07-16 DIAGNOSIS — I4891 Unspecified atrial fibrillation: Secondary | ICD-10-CM | POA: Insufficient documentation

## 2011-07-16 DIAGNOSIS — Z79899 Other long term (current) drug therapy: Secondary | ICD-10-CM | POA: Insufficient documentation

## 2011-07-16 DIAGNOSIS — M1990 Unspecified osteoarthritis, unspecified site: Secondary | ICD-10-CM | POA: Insufficient documentation

## 2011-07-16 DIAGNOSIS — L89109 Pressure ulcer of unspecified part of back, unspecified stage: Secondary | ICD-10-CM | POA: Insufficient documentation

## 2011-07-16 NOTE — Progress Notes (Signed)
[facility]  NAME:  [name] [name], NILL NO.:  [id number]  MEDICAL RECORD NO.:  [id number]      DATE OF BIRTH:  05-Jun-1918  PHYSICIAN:  Maxwell Caul, M.D.      VISIT DATE:                                  OFFICE VISIT   CHIEF COMPLAINT:  Review of coccyx wounds.  This is a 77 year old man who lives at home with his wife.  He is minimally ambulating with a walker.  Worsened as he sleeps in a recliner chair at night.  His wife states that he has been dealing with areas of erythema and ulceration since a left hip fracture and repair which was done in a [place] in 2010.  They have been followed by a home health nurse who has been treating his wounds, however, the wife is concerned that the decubitus are worsening.  He is also concerned about swelling in his left leg and some areas over his spinal processes in his lower thoracic and lumbar areas.  The patient has had vascular studies in 2011.  These showed bilateral ABIs within normal limits.  Doppler waveforms on the right were normal. Duplex imaging revealed no evidence of stenosis.  There were triphasic waveforms on the left.  PAST MEDICAL HISTORY:  Includes a left hip repair, bilateral abdominal hernias, cataracts, osteoarthritis, atrial fibrillation, essential hypertension, gastroesophageal reflux disease.  Medication list is reviewed.  He is on Flomax, Nexium, Allegra, asa, and Lasix.  SOCIAL HISTORY:  The patient lives at home with his wife.  I get the sense that he is minimally ambulatory with a walker for short distances. He is not incontinent of urine.  He seems to sleep in a lift chair therefore reasons are not totally clear, however, his bed mobility and transfer mobility may be not very good.  REVIEW OF SYSTEMS:  RESPIRATORY:  The patient does not complain of shortness of breath.  CARDIAC:  No chest pain or orthopnea.  GU:  He is not incontinent.  PHYSICAL  EXAMINATION:  VITAL SIGNS:  His temperature, he was afebrile, pulse 61, respirations 18, blood pressure 114/62. GENERAL:  A frail man but conversational, able to participate in his own history. RESPIRATORY:  Clear air entry bilaterally.  No crackles or wheezes. Work of breathing is normal. CARDIAC:  Heart sounds were normal.  There was no signs of congestive heart failure. EXTREMITIES:  His left leg is more swollen than the right.  There was some venous stasis physiology here, some flaking of skin on the lower leg, however, there did not seem to be a wound here.  There is a healed wound over the left calcaneus apparently hold over from the hip surgery 2 years ago. SKIN:  There is a large area of erythema over much of his buttocks extending into the coccyx area.  There is some openings to this.  I suspect this is chronic candidiasis complicated by pressure-induced injury.  There are probably stage I pressure areas over several of his lower thoracic and thoracic vertebrae.  There were no open wounds here.  IMPRESSION:  Sacral decubitus ulcers.  I think this is complicated by surrounding chronic candidiasis and unrelieved pressure with shear while he is sleeping in a lift chair at  night.  I have cautioned strongly against this.  We have requested a hospital bed with pressure relieving mattress which should make it possible for this man to sleep in bed rather than in the lift chair.  To the actual wound surface, I prescribed Lotrisone cream lightly b.i.d.  We will use a large ABD pad to protect this.  I think he would also qualify for a Roho cushion in his wheelchair, and we will see about this as well.  We will review him again in 2 weeks' time.          ______________________________ Maxwell Caul, M.D.     MGR/MEDQ  D:  07/16/2011  T:  07/16/2011  Job:  [id number]

## 2011-07-30 ENCOUNTER — Encounter (HOSPITAL_BASED_OUTPATIENT_CLINIC_OR_DEPARTMENT_OTHER): Payer: Medicare Other | Attending: Internal Medicine

## 2011-07-30 ENCOUNTER — Encounter: Payer: Self-pay | Admitting: Internal Medicine

## 2011-07-30 DIAGNOSIS — I442 Atrioventricular block, complete: Secondary | ICD-10-CM

## 2011-07-30 DIAGNOSIS — L8992 Pressure ulcer of unspecified site, stage 2: Secondary | ICD-10-CM | POA: Insufficient documentation

## 2011-07-30 DIAGNOSIS — L89309 Pressure ulcer of unspecified buttock, unspecified stage: Secondary | ICD-10-CM | POA: Insufficient documentation

## 2011-08-13 ENCOUNTER — Encounter (HOSPITAL_BASED_OUTPATIENT_CLINIC_OR_DEPARTMENT_OTHER): Payer: Medicare Other

## 2011-09-07 ENCOUNTER — Telehealth: Payer: Self-pay | Admitting: *Deleted

## 2011-09-07 NOTE — Telephone Encounter (Signed)
Albin Felling, RN called wanting verbal orders PT evaluate and treat to help with pt's wife.  Also pt is going to go to [facility] for contact dermatitis and she wants an order to see pt once weekly till they can get him an appt.  Ok per Dr Cato Mulligan, verbal order given

## 2011-09-10 ENCOUNTER — Ambulatory Visit (INDEPENDENT_AMBULATORY_CARE_PROVIDER_SITE_OTHER): Payer: Medicare Other | Admitting: Internal Medicine

## 2011-09-10 ENCOUNTER — Telehealth: Payer: Self-pay | Admitting: Internal Medicine

## 2011-09-10 ENCOUNTER — Telehealth: Payer: Self-pay

## 2011-09-10 ENCOUNTER — Encounter: Payer: Self-pay | Admitting: Internal Medicine

## 2011-09-10 DIAGNOSIS — I70219 Atherosclerosis of native arteries of extremities with intermittent claudication, unspecified extremity: Secondary | ICD-10-CM

## 2011-09-10 DIAGNOSIS — R600 Localized edema: Secondary | ICD-10-CM

## 2011-09-10 DIAGNOSIS — R609 Edema, unspecified: Secondary | ICD-10-CM

## 2011-09-10 DIAGNOSIS — I10 Essential (primary) hypertension: Secondary | ICD-10-CM

## 2011-09-10 NOTE — Telephone Encounter (Signed)
Took over head page - spoke with wife - wound better - feet purple in color and swollen- cardiology instructed them to call here - dr. Amador Cunas will see today at 215pm.

## 2011-09-10 NOTE — Telephone Encounter (Signed)
New msg Pt's wife called and said her husband's feet are purple and swollen. She wants to talk to someone. Please call

## 2011-09-10 NOTE — Telephone Encounter (Signed)
ROV this PM

## 2011-09-10 NOTE — Telephone Encounter (Signed)
oppened in error

## 2011-09-10 NOTE — Telephone Encounter (Signed)
Spoke with Dr Lubertha Basque nurse, Tresa Endo who advised pt to call Dr Amador Cunas who has most recently seen him for this problem.

## 2011-09-10 NOTE — Progress Notes (Signed)
  Subjective:    Patient ID: [name] [name], male    DOB: 27-Sep-1917, 78 y.o.   MRN: [id number]  HPI  78 year old patient who is seen today for followup. He has a history of atherosclerosis and claudication. He also has a history of a chronic sacral decubiti and has made nice progress at the wound center. He is also followed by home health services who are concerned about his feet that have become more discolored. He denies any foot pain. He does have chronic pedal edema the left greater than the right this is unchanged he is on low-dose Lasix as well as aspirin therapy. His lower extremities are not painful and the color improves with elevation and worsens with the dependent position    Review of Systems  Constitutional: Negative for fever, chills, appetite change and fatigue.  HENT: Negative for hearing loss, ear pain, congestion, sore throat, trouble swallowing, neck stiffness, dental problem, voice change and tinnitus.   Eyes: Negative for pain, discharge and visual disturbance.  Respiratory: Negative for cough, chest tightness, wheezing and stridor.   Cardiovascular: Negative for chest pain, palpitations and leg swelling.  Gastrointestinal: Negative for nausea, vomiting, abdominal pain, diarrhea, constipation, blood in stool and abdominal distention.  Genitourinary: Negative for urgency, hematuria, flank pain, discharge, difficulty urinating and genital sores.  Musculoskeletal: Negative for myalgias, back pain, joint swelling, arthralgias and gait problem.  Skin: Positive for wound (sacrum decubitus improving). Negative for rash. Color change:  cyanosis of the lower extremities.  Neurological: Negative for dizziness, syncope, speech difficulty, weakness, numbness and headaches.  Hematological: Negative for adenopathy. Does not bruise/bleed easily.  Psychiatric/Behavioral: Negative for behavioral problems and dysphoric mood. The patient is not nervous/anxious.        Objective:   Physical  Exam  Constitutional:       Repeat blood pressure 140/80  Pulse rate 65  O2 saturation 95  Musculoskeletal:       Lower extremity edema left greater than the right. Feet and especially the toes are cyanotic but did blanch with pressure. There is no tenderness;  feet were cold to touch          Assessment & Plan:   Chronic lower extremity arterial insufficiency. No evidence of acute ischemia. No active ulcerations or pain Chronic lower extremity edema. We'll continue modest dose Lasix. This does improve with elevation

## 2011-09-10 NOTE — Patient Instructions (Signed)
Notify the office if you develop  any significant foot pain or any open sores  Limit your sodium (Salt) intake  Keep legs elevated as much as possible  Call or return to clinic prn if these symptoms worsen or fail to improve as anticipated.

## 2011-09-10 NOTE — Telephone Encounter (Signed)
HH nurse, Albin Felling, is at pt's home right now and reporting that his toes are cynotic (bil) almost to the point of being black.  His legs (bil) from his toes to his knees are reddish purple in color.  She states this is new for him.  She didn't notice it last Thursday.  He has 2+ edema left leg and 1+ edema right leg which is his normal state.  His pedal pulses on right foot are faint but she is unable to feel them on his left.  BP=102/60, P=41.  No pain, legs are not warm to the touch and not cool to the touch.  She is requesting that Mrs Salva be called back with instructions.

## 2011-09-17 ENCOUNTER — Encounter (HOSPITAL_BASED_OUTPATIENT_CLINIC_OR_DEPARTMENT_OTHER): Payer: Medicare Other | Attending: Internal Medicine

## 2011-09-17 DIAGNOSIS — M1990 Unspecified osteoarthritis, unspecified site: Secondary | ICD-10-CM | POA: Insufficient documentation

## 2011-09-17 DIAGNOSIS — I10 Essential (primary) hypertension: Secondary | ICD-10-CM | POA: Insufficient documentation

## 2011-09-17 DIAGNOSIS — L89309 Pressure ulcer of unspecified buttock, unspecified stage: Secondary | ICD-10-CM | POA: Insufficient documentation

## 2011-09-17 DIAGNOSIS — K219 Gastro-esophageal reflux disease without esophagitis: Secondary | ICD-10-CM | POA: Insufficient documentation

## 2011-09-17 DIAGNOSIS — Z79899 Other long term (current) drug therapy: Secondary | ICD-10-CM | POA: Insufficient documentation

## 2011-09-17 DIAGNOSIS — B369 Superficial mycosis, unspecified: Secondary | ICD-10-CM | POA: Insufficient documentation

## 2011-09-17 DIAGNOSIS — L8990 Pressure ulcer of unspecified site, unspecified stage: Secondary | ICD-10-CM | POA: Insufficient documentation

## 2011-10-07 ENCOUNTER — Other Ambulatory Visit: Payer: Self-pay | Admitting: *Deleted

## 2011-10-07 MED ORDER — ESOMEPRAZOLE MAGNESIUM 40 MG PO CPDR: 40.0000 mg | DELAYED_RELEASE_CAPSULE | Freq: Every day | ORAL | Status: DC

## 2011-10-15 ENCOUNTER — Encounter (HOSPITAL_BASED_OUTPATIENT_CLINIC_OR_DEPARTMENT_OTHER): Payer: Medicare Other

## 2011-10-22 ENCOUNTER — Encounter (HOSPITAL_BASED_OUTPATIENT_CLINIC_OR_DEPARTMENT_OTHER): Payer: Medicare Other | Attending: Internal Medicine

## 2011-10-22 DIAGNOSIS — L8990 Pressure ulcer of unspecified site, unspecified stage: Secondary | ICD-10-CM | POA: Insufficient documentation

## 2011-10-22 DIAGNOSIS — I10 Essential (primary) hypertension: Secondary | ICD-10-CM | POA: Insufficient documentation

## 2011-10-22 DIAGNOSIS — L89309 Pressure ulcer of unspecified buttock, unspecified stage: Secondary | ICD-10-CM | POA: Insufficient documentation

## 2011-10-22 DIAGNOSIS — Z79899 Other long term (current) drug therapy: Secondary | ICD-10-CM | POA: Insufficient documentation

## 2011-10-29 ENCOUNTER — Telehealth: Payer: Self-pay | Admitting: Internal Medicine

## 2011-10-29 DIAGNOSIS — L89309 Pressure ulcer of unspecified buttock, unspecified stage: Secondary | ICD-10-CM

## 2011-10-29 NOTE — Telephone Encounter (Signed)
Pt has bed sore on buttock and home health nurse suggested maybe patient should see dermatologist.

## 2011-10-29 NOTE — Telephone Encounter (Signed)
Please advise 

## 2011-10-30 NOTE — Telephone Encounter (Signed)
Ok per Dr. Swords, referral order placed 

## 2011-11-06 ENCOUNTER — Encounter (HOSPITAL_BASED_OUTPATIENT_CLINIC_OR_DEPARTMENT_OTHER): Payer: Medicare Other | Attending: Internal Medicine

## 2011-11-06 DIAGNOSIS — L89309 Pressure ulcer of unspecified buttock, unspecified stage: Secondary | ICD-10-CM | POA: Insufficient documentation

## 2011-11-06 DIAGNOSIS — L8990 Pressure ulcer of unspecified site, unspecified stage: Secondary | ICD-10-CM | POA: Insufficient documentation

## 2011-11-06 DIAGNOSIS — I872 Venous insufficiency (chronic) (peripheral): Secondary | ICD-10-CM | POA: Insufficient documentation

## 2011-11-06 DIAGNOSIS — Z79899 Other long term (current) drug therapy: Secondary | ICD-10-CM | POA: Insufficient documentation

## 2011-11-06 DIAGNOSIS — L0231 Cutaneous abscess of buttock: Secondary | ICD-10-CM | POA: Insufficient documentation

## 2011-11-06 DIAGNOSIS — L03317 Cellulitis of buttock: Secondary | ICD-10-CM | POA: Insufficient documentation

## 2011-11-09 ENCOUNTER — Encounter (HOSPITAL_BASED_OUTPATIENT_CLINIC_OR_DEPARTMENT_OTHER): Payer: Medicare Other

## 2011-11-10 ENCOUNTER — Encounter: Payer: Self-pay | Admitting: Internal Medicine

## 2011-11-10 ENCOUNTER — Ambulatory Visit (INDEPENDENT_AMBULATORY_CARE_PROVIDER_SITE_OTHER): Payer: Medicare Other | Admitting: Internal Medicine

## 2011-11-10 VITALS — BP 90/52 | HR 67 | Wt 135.0 lb

## 2011-11-10 DIAGNOSIS — I4891 Unspecified atrial fibrillation: Secondary | ICD-10-CM

## 2011-11-10 DIAGNOSIS — Z950 Presence of cardiac pacemaker: Secondary | ICD-10-CM

## 2011-11-10 DIAGNOSIS — I442 Atrioventricular block, complete: Secondary | ICD-10-CM

## 2011-11-10 DIAGNOSIS — I10 Essential (primary) hypertension: Secondary | ICD-10-CM

## 2011-11-10 LAB — PACEMAKER DEVICE OBSERVATION
ATRIAL PACING PM: 13
BAMS-0003: 70 {beats}/min
BATTERY VOLTAGE: 2.8 V
VENTRICULAR PACING PM: 93

## 2011-11-10 NOTE — Assessment & Plan Note (Signed)
His blood pressure is a bit on the low side today but he is asymptomatic. I have asked the patient to increase his intake.

## 2011-11-10 NOTE — Progress Notes (Signed)
HPI [name] returns today for followup. He is a very pleasant 78 year old man with symptomatic bradycardia status post permanent pacemaker insertion secondary to complete heart block. In the interim he has done well. He denies palpitations. He denies chest pain or shortness of breath. He is bothered most by severe arthritis. He is not considered a surgical candidate for joint repair or replacement. The patient has pain on some days but on other days he is able to get around a bit. He denies syncope or falls. Allergies  Allergen Reactions  . Cortisone Acetate     REACTION: reacted to injection in back     Current Outpatient Prescriptions  Medication Sig Dispense Refill  . aspirin 81 MG tablet Take 81 mg by mouth daily.        . Calcium Carbonate-Vitamin D (CALCIUM-VITAMIN D) 600-200 MG-UNIT CAPS Take 1 capsule by mouth 2 (two) times daily.       Marland Kitchen esomeprazole (NEXIUM) 40 MG capsule Take 1 capsule (40 mg total) by mouth daily before breakfast.  90 capsule  0  . furosemide (LASIX) 20 MG tablet Take 1 tablet (20 mg total) by mouth daily.  90 tablet  3  . mometasone (NASONEX) 50 MCG/ACT nasal spray 2 sprays by Nasal route daily. As directed       . Multiple Vitamin (MULTIVITAMIN) tablet Take 1 tablet by mouth daily.        Bertram Gala Glycol-Propyl Glycol (SYSTANE) 0.4-0.3 % SOLN Apply to eye 2 (two) times daily.      . pramipexole (MIRAPEX) 0.25 MG tablet Take 0.25 mg by mouth at bedtime as needed.        . Tamsulosin HCl (FLOMAX) 0.4 MG CAPS Take 0.4 mg by mouth daily.           Past Medical History  Diagnosis Date  . ATHEROSCLEROSIS W /INT CLAUDICATION 06/06/2008  . AV BLOCK, COMPLETE 06/06/2008  . BENIGN PROSTATIC HYPERTROPHY, WITH OBSTRUCTION 09/02/2009  . DVT, HX OF 06/04/2009  . GERD 05/16/2007  . HYPERTENSION 05/16/2007  . OSTEOARTHRITIS, KNEES, BILATERAL 07/12/2008  . PACEMAKER, PERMANENT 06/06/2008  . Pruritus   . Decubitus ulcer, buttock   . Decubitus ulcer, lower back   .  Atrial fibrillation   . PVD (peripheral vascular disease)   . Barrett esophagus     ROS:   All systems reviewed and negative except as noted in the HPI.   Past Surgical History  Procedure Date  . Cataract extraction   . Skin graft     to heel  . Hip fracture surgery   . Pacemaker insertion   . Tonsillectomy      No family history on file.   History   Social History  . Marital Status: Married    Spouse Name: N/A    Number of Children: N/A  . Years of Education: N/A   Occupational History  . Not on file.   Social History Main Topics  . Smoking status: Former Smoker    Quit date: 07/27/1944  . Smokeless tobacco: Never Used  . Alcohol Use: No  . Drug Use: No  . Sexually Active: Not on file   Other Topics Concern  . Not on file   Social History Narrative  . No narrative on file     BP 90/52  Pulse 67  Wt 61.236 kg (135 lb)  SpO2 96%  Physical Exam:  Elderly appearing NAD HEENT: Unremarkable Neck:  No JVD, no thyromegally Back:  No CVA tenderness,  marked kyphosis is present. Lungs:  Clear with no wheezes, rales, or rhonchi. HEART: IRegular rate rhythm, no murmurs, no rubs, no clicks Abd:  soft, positive bowel sounds, no organomegally, no rebound, no guarding Ext:  2 plus pulses, no edema, no cyanosis, no clubbing Skin:  No rashes no nodules Neuro:  CN II through XII intact, motor grossly intact  DEVICE  Normal device function.  See PaceArt for details.   Assess/Plan:

## 2011-11-10 NOTE — Assessment & Plan Note (Signed)
He is out of rhythm approximately 5% of the time. He is not symptomatic with this. The patient is not on anticoagulation secondary to his increased risk for falls secondary to instability.

## 2011-11-10 NOTE — Assessment & Plan Note (Signed)
His device is working normally. We'll plan to recheck in several months. 

## 2011-11-10 NOTE — Patient Instructions (Signed)
Your physician wants you to follow-up in:  12 months.  You will receive a reminder letter in the mail two months in advance. If you don't receive a letter, please call our office to schedule the follow-up appointment.   

## 2011-11-12 ENCOUNTER — Encounter (HOSPITAL_BASED_OUTPATIENT_CLINIC_OR_DEPARTMENT_OTHER): Payer: Medicare Other

## 2011-12-01 ENCOUNTER — Telehealth: Payer: Self-pay | Admitting: Family Medicine

## 2011-12-01 ENCOUNTER — Other Ambulatory Visit: Payer: Self-pay | Admitting: *Deleted

## 2011-12-01 MED ORDER — FUROSEMIDE 20 MG PO TABS: 20.0000 mg | ORAL_TABLET | Freq: Every day | ORAL | Status: DC

## 2011-12-01 MED ORDER — ESOMEPRAZOLE MAGNESIUM 40 MG PO CPDR: 40.0000 mg | DELAYED_RELEASE_CAPSULE | Freq: Every day | ORAL | Status: DC

## 2011-12-01 MED ORDER — MOMETASONE FUROATE 50 MCG/ACT NA SUSP: 2.0000 | Freq: Every day | NASAL | Status: DC

## 2011-12-01 NOTE — Telephone Encounter (Signed)
rx sent in electronically 

## 2011-12-01 NOTE — Telephone Encounter (Signed)
Pt is out of Nasonex. He usually gets his prescriptions mail order, [facility]. But since he's out, wife wants to have [facility] [address], over by [employer]. Thanks.

## 2011-12-03 ENCOUNTER — Encounter (HOSPITAL_BASED_OUTPATIENT_CLINIC_OR_DEPARTMENT_OTHER): Payer: Medicare Other | Attending: Internal Medicine

## 2011-12-03 DIAGNOSIS — L89309 Pressure ulcer of unspecified buttock, unspecified stage: Secondary | ICD-10-CM | POA: Insufficient documentation

## 2011-12-03 DIAGNOSIS — L8992 Pressure ulcer of unspecified site, stage 2: Secondary | ICD-10-CM | POA: Insufficient documentation

## 2011-12-10 ENCOUNTER — Encounter (HOSPITAL_BASED_OUTPATIENT_CLINIC_OR_DEPARTMENT_OTHER): Payer: Medicare Other

## 2012-01-07 ENCOUNTER — Encounter (HOSPITAL_BASED_OUTPATIENT_CLINIC_OR_DEPARTMENT_OTHER): Payer: Medicare Other

## 2012-01-14 ENCOUNTER — Encounter (HOSPITAL_BASED_OUTPATIENT_CLINIC_OR_DEPARTMENT_OTHER): Payer: Medicare Other | Attending: Internal Medicine

## 2012-01-14 DIAGNOSIS — R21 Rash and other nonspecific skin eruption: Secondary | ICD-10-CM | POA: Insufficient documentation

## 2012-01-14 DIAGNOSIS — L8990 Pressure ulcer of unspecified site, unspecified stage: Secondary | ICD-10-CM | POA: Insufficient documentation

## 2012-01-14 DIAGNOSIS — L89309 Pressure ulcer of unspecified buttock, unspecified stage: Secondary | ICD-10-CM | POA: Insufficient documentation

## 2012-01-14 DIAGNOSIS — L905 Scar conditions and fibrosis of skin: Secondary | ICD-10-CM | POA: Insufficient documentation

## 2012-02-11 DIAGNOSIS — I442 Atrioventricular block, complete: Secondary | ICD-10-CM

## 2012-02-18 ENCOUNTER — Encounter (HOSPITAL_BASED_OUTPATIENT_CLINIC_OR_DEPARTMENT_OTHER): Payer: Medicare Other | Attending: Internal Medicine

## 2012-02-18 DIAGNOSIS — L8992 Pressure ulcer of unspecified site, stage 2: Secondary | ICD-10-CM | POA: Insufficient documentation

## 2012-02-18 DIAGNOSIS — L89309 Pressure ulcer of unspecified buttock, unspecified stage: Secondary | ICD-10-CM | POA: Insufficient documentation

## 2012-02-25 ENCOUNTER — Encounter (HOSPITAL_BASED_OUTPATIENT_CLINIC_OR_DEPARTMENT_OTHER): Payer: Medicare Other | Attending: Internal Medicine

## 2012-02-25 DIAGNOSIS — L89309 Pressure ulcer of unspecified buttock, unspecified stage: Secondary | ICD-10-CM | POA: Insufficient documentation

## 2012-02-25 DIAGNOSIS — L8990 Pressure ulcer of unspecified site, unspecified stage: Secondary | ICD-10-CM | POA: Insufficient documentation

## 2012-03-17 ENCOUNTER — Encounter (HOSPITAL_BASED_OUTPATIENT_CLINIC_OR_DEPARTMENT_OTHER): Payer: Medicare Other

## 2012-05-12 DIAGNOSIS — I442 Atrioventricular block, complete: Secondary | ICD-10-CM

## 2012-08-11 DIAGNOSIS — I442 Atrioventricular block, complete: Secondary | ICD-10-CM

## 2012-08-19 ENCOUNTER — Telehealth: Payer: Self-pay | Admitting: Internal Medicine

## 2012-08-19 NOTE — Telephone Encounter (Signed)
Per spouse pt has a remote check on 4/17 and she wants it canceled since he will be in office on 4/29

## 2012-08-22 NOTE — Telephone Encounter (Signed)
Remote canceled, patient aware.

## 2012-11-02 ENCOUNTER — Telehealth: Payer: Self-pay | Admitting: Internal Medicine

## 2012-11-02 NOTE — Telephone Encounter (Signed)
ok 

## 2012-11-02 NOTE — Telephone Encounter (Signed)
Patient called stating that he would like to be seen by Dr. Amador Cunas as he has been providing care due to scheduling conflict. Please advise.

## 2012-11-03 NOTE — Telephone Encounter (Signed)
ok 

## 2012-11-04 NOTE — Telephone Encounter (Signed)
appt scheduled

## 2012-11-21 ENCOUNTER — Encounter (HOSPITAL_BASED_OUTPATIENT_CLINIC_OR_DEPARTMENT_OTHER): Payer: Medicare Other | Attending: General Surgery

## 2012-11-21 DIAGNOSIS — L89109 Pressure ulcer of unspecified part of back, unspecified stage: Secondary | ICD-10-CM | POA: Insufficient documentation

## 2012-11-21 DIAGNOSIS — L89309 Pressure ulcer of unspecified buttock, unspecified stage: Secondary | ICD-10-CM | POA: Insufficient documentation

## 2012-11-21 DIAGNOSIS — L8990 Pressure ulcer of unspecified site, unspecified stage: Secondary | ICD-10-CM | POA: Insufficient documentation

## 2012-11-21 DIAGNOSIS — Z79899 Other long term (current) drug therapy: Secondary | ICD-10-CM | POA: Insufficient documentation

## 2012-11-22 ENCOUNTER — Encounter: Payer: Self-pay | Admitting: Internal Medicine

## 2012-11-22 ENCOUNTER — Ambulatory Visit (INDEPENDENT_AMBULATORY_CARE_PROVIDER_SITE_OTHER): Payer: Medicare Other | Admitting: Internal Medicine

## 2012-11-22 ENCOUNTER — Encounter (HOSPITAL_COMMUNITY): Payer: Medicare Other

## 2012-11-22 VITALS — BP 80/64

## 2012-11-22 DIAGNOSIS — I442 Atrioventricular block, complete: Secondary | ICD-10-CM

## 2012-11-22 DIAGNOSIS — I10 Essential (primary) hypertension: Secondary | ICD-10-CM

## 2012-11-22 DIAGNOSIS — I4891 Unspecified atrial fibrillation: Secondary | ICD-10-CM

## 2012-11-22 DIAGNOSIS — Z950 Presence of cardiac pacemaker: Secondary | ICD-10-CM

## 2012-11-22 LAB — PACEMAKER DEVICE OBSERVATION
AL AMPLITUDE: 3.8 mv
AL IMPEDENCE PM: 373 Ohm
BAMS-0001: 150 {beats}/min
BAMS-0003: 70 {beats}/min
BRDY-0002RV: 60 {beats}/min
BRDY-0003RV: 100 {beats}/min
RV LEAD AMPLITUDE: 5.2 mv
RV LEAD THRESHOLD: 0.5 V

## 2012-11-22 NOTE — Progress Notes (Signed)
[facility]  NAME:  GAVIN, TELFORD NO.:  [id number]  MEDICAL RECORD NO.:  [id number]      DATE OF BIRTH:  [birth year]  PHYSICIAN:  Ardath Sax, M.D.      VISIT DATE:  11/21/2012                                  OFFICE VISIT   This patient is a 79 year old gentleman who lives at home with his wife. He spends most of his day in a wheelchair even sleeping hours.  He has had a left hip fracture that was repaired in 2010.  He is taken care for the most part, by his wife.  He has had decubitus ulcers on his sacrum in the past.  Today, he comes back with sacral decubiti and a fairly large right ischial decubitus which is going to be very troublesome to heal.  He is quite feeble, although his brain is very sharp, but he otherwise is reasonably healthy.  He has had vascular workup which showed no evidence of any obvious arterial occlusions.  He is on several medicines including Flomax, Nexium, Allegra, and Lasix.  He was examined today and he was found to have blood pressure 92/59, pulse 49, temperature 98.  He weighs 138 pounds.  So, today, I am going to treat this patient with silver alginate dressings and we were lining up for home nurses to come by and were also lining up for him to get an air mattress.  I think this will be an integral part of him healing these pressure ulcers.  I am staging him as a sacral and ischial pressure ulcers stage III.     Ardath Sax, M.D.     PP/MEDQ  D:  11/21/2012  T:  11/22/2012  Job:  [id number]

## 2012-11-22 NOTE — Patient Instructions (Signed)
Your physician wants you to follow-up in: 12 months with Dr. Taylor. You will receive a reminder letter in the mail two months in advance. If you don't receive a letter, please call our office to schedule the follow-up appointment.    

## 2012-11-22 NOTE — Assessment & Plan Note (Signed)
His St. Jude dual-chamber pacemaker is working normally. We'll plan to recheck in several months. 

## 2012-11-22 NOTE — Assessment & Plan Note (Signed)
His ventricular rate is well controlled. No change in medical therapy. He is not a candidate for anticoagulation.

## 2012-11-22 NOTE — Progress Notes (Signed)
HPI [name] [name] returns today for followup. He is a very pleasant 79 year old man with a history of complete heart block, status post permanent pacemaker insertion, with a history of atrial fibrillation. The patient is not on anticoagulation secondary to his propensity for falls and generalized debilitated condition. He has fatigue and weakness. He is lost weight. He denies chest pain or shortness of breath. He has mild chronic peripheral edema. Allergies  Allergen Reactions  . Cortisone Acetate     REACTION: reacted to injection in back     Current Outpatient Prescriptions  Medication Sig Dispense Refill  . aspirin 81 MG tablet Take 81 mg by mouth daily.        . Calcium Carbonate-Vitamin D (CALCIUM-VITAMIN D) 600-200 MG-UNIT CAPS Take 1 capsule by mouth 2 (two) times daily.       Marland Kitchen esomeprazole (NEXIUM) 40 MG capsule Take 1 capsule (40 mg total) by mouth daily before breakfast.  90 capsule  1  . furosemide (LASIX) 20 MG tablet Take 1 tablet (20 mg total) by mouth daily.  90 tablet  1  . mometasone (NASONEX) 50 MCG/ACT nasal spray Place 2 sprays into the nose daily. As directed  17 g  1  . Multiple Vitamin (MULTIVITAMIN) tablet Take 1 tablet by mouth daily.        Bertram Gala Glycol-Propyl Glycol (SYSTANE) 0.4-0.3 % SOLN Apply to eye 2 (two) times daily.       No current facility-administered medications for this visit.     Past Medical History  Diagnosis Date  . ATHEROSCLEROSIS W /INT CLAUDICATION 06/06/2008  . AV BLOCK, COMPLETE 06/06/2008  . BENIGN PROSTATIC HYPERTROPHY, WITH OBSTRUCTION 09/02/2009  . DVT, HX OF 06/04/2009  . GERD 05/16/2007  . HYPERTENSION 05/16/2007  . OSTEOARTHRITIS, KNEES, BILATERAL 07/12/2008  . PACEMAKER, PERMANENT 06/06/2008  . Pruritus   . Decubitus ulcer, buttock   . Decubitus ulcer, lower back   . Atrial fibrillation   . PVD (peripheral vascular disease)   . Barrett esophagus     ROS:   All systems reviewed and negative except as noted in the  HPI.   Past Surgical History  Procedure Laterality Date  . Cataract extraction    . Skin graft      to heel  . Hip fracture surgery    . Pacemaker insertion    . Tonsillectomy       No family history on file.   History   Social History  . Marital Status: Married    Spouse Name: N/A    Number of Children: N/A  . Years of Education: N/A   Occupational History  . Not on file.   Social History Main Topics  . Smoking status: Former Smoker    Quit date: 07/27/1944  . Smokeless tobacco: Never Used  . Alcohol Use: No  . Drug Use: No  . Sexually Active: Not on file   Other Topics Concern  . Not on file   Social History Narrative  . No narrative on file     BP 80/64  Physical Exam:  Chronically ill appearing elderly man, NAD HEENT: Unremarkable Neck:  7 cm JVD, no thyromegally Back:  No CVA tenderness, marked kyphosis Lungs:  Clear except for scattered rales. No wheezes or rhonchi. HEART:  Regular rate rhythm, no murmurs, no rubs, no clicks Abd:  soft, positive bowel sounds, no organomegally, no rebound, no guarding Ext:  2 plus pulses, trace peripheral edema, no cyanosis, no clubbing Skin:  No  rashes no nodules Neuro:  CN II through XII intact, motor grossly intact  DEVICE  Normal device function.  See PaceArt for details.   Assess/Plan:

## 2012-11-22 NOTE — Assessment & Plan Note (Signed)
His blood pressure is actually low today. He admits to reduced oral intake. I've encouraged the patient to eat more and increase his fluid intake. Except for low dose diuretic therapy, he is on no blood pressure medications at the current time.

## 2012-11-23 ENCOUNTER — Telehealth: Payer: Self-pay | Admitting: Internal Medicine

## 2012-11-23 MED ORDER — MOMETASONE FUROATE 50 MCG/ACT NA SUSP: 2.0000 | Freq: Every day | NASAL | Status: DC

## 2012-11-23 NOTE — Telephone Encounter (Signed)
Spoke to pt's wife [name] [name] and told her Rx sent to [facility] as requested. [name] [name] verbalized understanding.

## 2012-11-23 NOTE — Telephone Encounter (Signed)
Pt needs refill of mometasone (NASONEX) 50 MCG/ACT nasal spray.  Send to [facility].

## 2012-11-28 ENCOUNTER — Encounter (HOSPITAL_BASED_OUTPATIENT_CLINIC_OR_DEPARTMENT_OTHER): Payer: Medicare Other | Attending: General Surgery

## 2012-11-28 DIAGNOSIS — L89309 Pressure ulcer of unspecified buttock, unspecified stage: Secondary | ICD-10-CM | POA: Insufficient documentation

## 2012-11-28 DIAGNOSIS — L8993 Pressure ulcer of unspecified site, stage 3: Secondary | ICD-10-CM | POA: Insufficient documentation

## 2012-11-28 DIAGNOSIS — L89109 Pressure ulcer of unspecified part of back, unspecified stage: Secondary | ICD-10-CM | POA: Insufficient documentation

## 2012-12-16 ENCOUNTER — Other Ambulatory Visit: Payer: Self-pay | Admitting: *Deleted

## 2012-12-16 MED ORDER — ESOMEPRAZOLE MAGNESIUM 40 MG PO CPDR: 40.0000 mg | DELAYED_RELEASE_CAPSULE | Freq: Every day | ORAL | Status: DC

## 2012-12-26 ENCOUNTER — Encounter (HOSPITAL_BASED_OUTPATIENT_CLINIC_OR_DEPARTMENT_OTHER): Payer: Medicare Other | Attending: General Surgery

## 2012-12-26 DIAGNOSIS — L89309 Pressure ulcer of unspecified buttock, unspecified stage: Secondary | ICD-10-CM | POA: Insufficient documentation

## 2012-12-26 DIAGNOSIS — L8993 Pressure ulcer of unspecified site, stage 3: Secondary | ICD-10-CM | POA: Insufficient documentation

## 2013-01-16 ENCOUNTER — Encounter: Payer: Self-pay | Admitting: Internal Medicine

## 2013-01-16 ENCOUNTER — Ambulatory Visit (INDEPENDENT_AMBULATORY_CARE_PROVIDER_SITE_OTHER): Payer: Medicare Other | Admitting: Internal Medicine

## 2013-01-16 VITALS — BP 110/70 | HR 88 | Temp 97.5°F | Resp 20 | Wt 138.0 lb

## 2013-01-16 DIAGNOSIS — Z950 Presence of cardiac pacemaker: Secondary | ICD-10-CM

## 2013-01-16 DIAGNOSIS — I70219 Atherosclerosis of native arteries of extremities with intermittent claudication, unspecified extremity: Secondary | ICD-10-CM

## 2013-01-16 DIAGNOSIS — Z0000 Encounter for general adult medical examination without abnormal findings: Secondary | ICD-10-CM

## 2013-01-16 DIAGNOSIS — R300 Dysuria: Secondary | ICD-10-CM

## 2013-01-16 DIAGNOSIS — I4891 Unspecified atrial fibrillation: Secondary | ICD-10-CM

## 2013-01-16 LAB — POCT URINALYSIS DIPSTICK
Bilirubin, UA: NEGATIVE
Glucose, UA: NEGATIVE
Spec Grav, UA: 1.02
Urobilinogen, UA: 0.2

## 2013-01-16 LAB — COMPREHENSIVE METABOLIC PANEL
AST: 18 U/L (ref 0–37)
Albumin: 3.8 g/dL (ref 3.5–5.2)
Alkaline Phosphatase: 87 U/L (ref 39–117)
BUN: 29 mg/dL — ABNORMAL HIGH (ref 6–23)
Potassium: 4.4 mEq/L (ref 3.5–5.1)
Total Bilirubin: 1.5 mg/dL — ABNORMAL HIGH (ref 0.3–1.2)

## 2013-01-16 LAB — CBC WITH DIFFERENTIAL/PLATELET
Basophils Relative: 0.9 % (ref 0.0–3.0)
Eosinophils Absolute: 0.2 10*3/uL (ref 0.0–0.7)
Eosinophils Relative: 2.2 % (ref 0.0–5.0)
HCT: 41.6 % (ref 39.0–52.0)
Hemoglobin: 13.9 g/dL (ref 13.0–17.0)
Lymphs Abs: 0.9 10*3/uL (ref 0.7–4.0)
MCHC: 33.4 g/dL (ref 30.0–36.0)
MCV: 98.6 fl (ref 78.0–100.0)
Monocytes Absolute: 0.4 10*3/uL (ref 0.1–1.0)
Neutro Abs: 5.8 10*3/uL (ref 1.4–7.7)
Neutrophils Relative %: 78.8 % — ABNORMAL HIGH (ref 43.0–77.0)
RBC: 4.22 Mil/uL (ref 4.22–5.81)

## 2013-01-16 MED ORDER — ESOMEPRAZOLE MAGNESIUM 40 MG PO CPDR: 40.0000 mg | DELAYED_RELEASE_CAPSULE | Freq: Every day | ORAL | Status: DC

## 2013-01-16 MED ORDER — SILVER SULFADIAZINE 1 % EX CREA: 1.0000 "application " | TOPICAL_CREAM | Freq: Two times a day (BID) | CUTANEOUS | Status: DC

## 2013-01-16 MED ORDER — FUROSEMIDE 20 MG PO TABS: 20.0000 mg | ORAL_TABLET | Freq: Every day | ORAL | Status: AC

## 2013-01-16 MED ORDER — MOMETASONE FUROATE 50 MCG/ACT NA SUSP: 2.0000 | Freq: Every day | NASAL | Status: DC

## 2013-01-16 MED ORDER — TAMSULOSIN HCL 0.4 MG PO CAPS: 0.4000 mg | ORAL_CAPSULE | Freq: Every day | ORAL | Status: DC

## 2013-01-16 MED ORDER — FUROSEMIDE 20 MG PO TABS: 20.0000 mg | ORAL_TABLET | Freq: Every day | ORAL | Status: DC

## 2013-01-16 NOTE — Progress Notes (Deleted)
Patient ID: [name] [name], male   DOB: Dec 29, 1917, 79 y.o.   MRN: [id number]

## 2013-01-16 NOTE — Patient Instructions (Addendum)
Limit your sodium (Salt) intake  Followup wound Center  Followup cardiology  Return here one year or as needed

## 2013-01-16 NOTE — Progress Notes (Signed)
Subjective:    Patient ID: [name] [name], male    DOB: 05-31-1918, 79 y.o.   MRN: [id number]  HPI  79 year old patient who is seen today for a health maintenance examination. He is followed by cardiology annually for a pacemaker check. He has a history of complete AV block. He has not been seen here in over one year. His most significant problem is a chronic sacral decubiti. He is followed once in her and does have a visiting nurse change bandages twice weekly. He presently is also receiving some home physical therapy.  The patient is able to stand briefly but must hold onto an object to stabilize. He has chronic lower extremity edema and a prior history of DVT. He has BPH and a history of osteoarthritis. He has also atherosclerotic cardiovascular disease with claudication. He has atrial fibrillation and history of hypertension  Past Medical History  Diagnosis Date  . ATHEROSCLEROSIS W /INT CLAUDICATION 06/06/2008  . AV BLOCK, COMPLETE 06/06/2008  . BENIGN PROSTATIC HYPERTROPHY, WITH OBSTRUCTION 09/02/2009  . DVT, HX OF 06/04/2009  . GERD 05/16/2007  . HYPERTENSION 05/16/2007  . OSTEOARTHRITIS, KNEES, BILATERAL 07/12/2008  . PACEMAKER, PERMANENT 06/06/2008  . Pruritus   . Decubitus ulcer, buttock   . Decubitus ulcer, lower back   . Atrial fibrillation   . PVD (peripheral vascular disease)   . Barrett esophagus     History   Social History  . Marital Status: Married    Spouse Name: N/A    Number of Children: N/A  . Years of Education: N/A   Occupational History  . Not on file.   Social History Main Topics  . Smoking status: Former Smoker    Quit date: 07/27/1944  . Smokeless tobacco: Never Used  . Alcohol Use: No  . Drug Use: No  . Sexually Active: Not on file   Other Topics Concern  . Not on file   Social History Narrative  . No narrative on file    Past Surgical History  Procedure Laterality Date  . Cataract extraction    . Skin graft      to heel  . Hip fracture  surgery    . Pacemaker insertion    . Tonsillectomy      No family history on file.  Allergies  Allergen Reactions  . Cortisone Acetate     REACTION: reacted to injection in back    Current Outpatient Prescriptions on File Prior to Visit  Medication Sig Dispense Refill  . aspirin 81 MG tablet Take 81 mg by mouth daily.        . Calcium Carbonate-Vitamin D (CALCIUM-VITAMIN D) 600-200 MG-UNIT CAPS Take 1 capsule by mouth 2 (two) times daily.       . mometasone (NASONEX) 50 MCG/ACT nasal spray Place 2 sprays into the nose daily. As directed  51 g  1  . Multiple Vitamin (MULTIVITAMIN) tablet Take 1 tablet by mouth daily.        Bertram Gala Glycol-Propyl Glycol (SYSTANE) 0.4-0.3 % SOLN Apply to eye 2 (two) times daily.       No current facility-administered medications on file prior to visit.    BP 110/70  Pulse 88  Temp(Src) 97.5 F (36.4 C) (Oral)  Resp 20  Wt 138 lb (62.596 kg)  BMI 19.26 kg/m2  SpO2 94%   1. Risk factors, based on past  M,S,F history- patient has atherosclerotic cardiovascular disease with chronic claudication which has been stable  2.  Physical activities:  Patient is able to stand only with assistance. Confined to bed and chair  3.  Depression/mood: No history depression or mood disorder  4.  Hearing: Mild deficits  5.  ADL's: Requires assistance in all aspects of daily living  6.  Fall risk: High. Unable to stand  unassisted  7.  Home safety: No problems identified at high fall risk  8.  Height weight, and visual acuity; height and weight stable no visual acuity issues  9.  Counseling: Followup cardiology  10. Lab orders based on risk factors: Laboratory update will be reviewed  11. Referral : Followup cardiology and wound care clinic  12. Care plan:  13. Cognitive assessment:        Review of Systems  Constitutional: Negative for fever, chills, appetite change and fatigue.  HENT: Negative for hearing loss, ear pain, congestion,  sore throat, trouble swallowing, neck stiffness, dental problem, voice change and tinnitus.   Eyes: Negative for pain, discharge and visual disturbance.  Respiratory: Negative for cough, chest tightness, wheezing and stridor.   Cardiovascular: Positive for leg swelling. Negative for chest pain and palpitations.  Gastrointestinal: Negative for nausea, vomiting, abdominal pain, diarrhea, constipation, blood in stool and abdominal distention.  Genitourinary: Negative for urgency, hematuria, flank pain, discharge, difficulty urinating and genital sores.  Musculoskeletal: Positive for back pain and gait problem. Negative for myalgias, joint swelling and arthralgias.  Skin: Positive for wound. Negative for rash.  Neurological: Positive for weakness. Negative for dizziness, syncope, speech difficulty, numbness and headaches.  Hematological: Negative for adenopathy. Does not bruise/bleed easily.  Psychiatric/Behavioral: Negative for behavioral problems and dysphoric mood. The patient is not nervous/anxious.        Objective:   Physical Exam  Constitutional: He appears well-developed and well-nourished.  Elderly frail alert no distress. Blood pressure 120/60  HENT:  Head: Normocephalic and atraumatic.  Right Ear: External ear normal.  Left Ear: External ear normal.  Nose: Nose normal.  Mouth/Throat: Oropharynx is clear and moist.  Eyes: Conjunctivae and EOM are normal. Pupils are equal, round, and reactive to light. No scleral icterus.  Neck: Normal range of motion. Neck supple. No JVD present. No thyromegaly present.  Cardiovascular: Normal rate, normal heart sounds and intact distal pulses.  Exam reveals no gallop and no friction rub.   No murmur heard. Irregular rhythm  Pulmonary/Chest: Effort normal. No respiratory distress. He has no wheezes. He has no rales. He exhibits no tenderness.  Pacemaker noted left upper anterior chest wall  Generally diminished breath sounds  Abdominal: Soft.  Bowel sounds are normal. He exhibits no distension and no mass. There is no tenderness.  Genitourinary: Penis normal.  Musculoskeletal: Normal range of motion. He exhibits edema. He exhibits no tenderness.  Edema both feet left greater than right  Lymphadenopathy:    He has no cervical adenopathy.  Neurological: He is alert. He has normal reflexes. No cranial nerve deficit. Coordination normal.  Skin: Skin is warm and dry. No rash noted.  Stage III sacral decubiti  Psychiatric: He has a normal mood and affect. His behavior is normal.          Assessment & Plan:   Preventive health exam Large sacral decubitus Atrial fibrillation Status post pacemaker for complete heart block Atherosclerotic cardiovascular disease Hypertension stable Chronic lower extremity edema. We'll continue low-dose furosemide  Will check updated lab Medications refilled We'll continue close followup at the wound Center  Recheck here one year or as needed

## 2013-01-30 ENCOUNTER — Telehealth: Payer: Self-pay | Admitting: Internal Medicine

## 2013-01-30 NOTE — Telephone Encounter (Signed)
Pt had labs drawn on 01-16-13. Pt is still waiting on results

## 2013-01-30 NOTE — Telephone Encounter (Signed)
Please call/notify patient that lab/test/procedure is normal 

## 2013-01-31 NOTE — Telephone Encounter (Signed)
Pt aware.

## 2013-02-06 ENCOUNTER — Encounter (HOSPITAL_BASED_OUTPATIENT_CLINIC_OR_DEPARTMENT_OTHER): Payer: Medicare Other | Attending: General Surgery

## 2013-02-06 DIAGNOSIS — L8993 Pressure ulcer of unspecified site, stage 3: Secondary | ICD-10-CM | POA: Insufficient documentation

## 2013-02-06 DIAGNOSIS — L89209 Pressure ulcer of unspecified hip, unspecified stage: Secondary | ICD-10-CM | POA: Insufficient documentation

## 2013-02-09 DIAGNOSIS — I442 Atrioventricular block, complete: Secondary | ICD-10-CM

## 2013-03-06 ENCOUNTER — Encounter (HOSPITAL_BASED_OUTPATIENT_CLINIC_OR_DEPARTMENT_OTHER): Payer: Medicare Other | Attending: General Surgery

## 2013-03-06 DIAGNOSIS — L89309 Pressure ulcer of unspecified buttock, unspecified stage: Secondary | ICD-10-CM | POA: Insufficient documentation

## 2013-03-06 DIAGNOSIS — L8993 Pressure ulcer of unspecified site, stage 3: Secondary | ICD-10-CM | POA: Insufficient documentation

## 2013-03-11 ENCOUNTER — Emergency Department (HOSPITAL_COMMUNITY)
Admission: EM | Admit: 2013-03-11 | Discharge: 2013-03-11 | Disposition: A | Discharge status: Home / Self Care | Payer: Medicare Other | Attending: Emergency Medicine | Admitting: Emergency Medicine

## 2013-03-11 ENCOUNTER — Encounter (HOSPITAL_COMMUNITY): Payer: Self-pay | Admitting: Emergency Medicine

## 2013-03-11 DIAGNOSIS — N401 Enlarged prostate with lower urinary tract symptoms: Secondary | ICD-10-CM | POA: Insufficient documentation

## 2013-03-11 DIAGNOSIS — Z950 Presence of cardiac pacemaker: Secondary | ICD-10-CM | POA: Insufficient documentation

## 2013-03-11 DIAGNOSIS — K219 Gastro-esophageal reflux disease without esophagitis: Secondary | ICD-10-CM | POA: Insufficient documentation

## 2013-03-11 DIAGNOSIS — IMO0002 Reserved for concepts with insufficient information to code with codable children: Secondary | ICD-10-CM | POA: Insufficient documentation

## 2013-03-11 DIAGNOSIS — Z87891 Personal history of nicotine dependence: Secondary | ICD-10-CM | POA: Insufficient documentation

## 2013-03-11 DIAGNOSIS — N138 Other obstructive and reflux uropathy: Secondary | ICD-10-CM | POA: Insufficient documentation

## 2013-03-11 DIAGNOSIS — Z872 Personal history of diseases of the skin and subcutaneous tissue: Secondary | ICD-10-CM | POA: Insufficient documentation

## 2013-03-11 DIAGNOSIS — Z8679 Personal history of other diseases of the circulatory system: Secondary | ICD-10-CM | POA: Insufficient documentation

## 2013-03-11 DIAGNOSIS — M1710 Unilateral primary osteoarthritis, unspecified knee: Secondary | ICD-10-CM | POA: Insufficient documentation

## 2013-03-11 DIAGNOSIS — Z8719 Personal history of other diseases of the digestive system: Secondary | ICD-10-CM | POA: Insufficient documentation

## 2013-03-11 DIAGNOSIS — Z86718 Personal history of other venous thrombosis and embolism: Secondary | ICD-10-CM | POA: Insufficient documentation

## 2013-03-11 DIAGNOSIS — Z79899 Other long term (current) drug therapy: Secondary | ICD-10-CM | POA: Insufficient documentation

## 2013-03-11 DIAGNOSIS — Z7982 Long term (current) use of aspirin: Secondary | ICD-10-CM | POA: Insufficient documentation

## 2013-03-11 DIAGNOSIS — N400 Enlarged prostate without lower urinary tract symptoms: Secondary | ICD-10-CM

## 2013-03-11 DIAGNOSIS — R339 Retention of urine, unspecified: Secondary | ICD-10-CM | POA: Insufficient documentation

## 2013-03-11 DIAGNOSIS — I10 Essential (primary) hypertension: Secondary | ICD-10-CM | POA: Insufficient documentation

## 2013-03-11 LAB — URINALYSIS, ROUTINE W REFLEX MICROSCOPIC
Glucose, UA: NEGATIVE mg/dL
Protein, ur: NEGATIVE mg/dL
Urobilinogen, UA: 1 mg/dL (ref 0.0–1.0)

## 2013-03-11 MED ORDER — ACETAMINOPHEN 325 MG PO TABS
650.0000 mg | ORAL_TABLET | Freq: Once | ORAL | Status: AC
  Administered 2013-03-11: 650 mg via ORAL
  Filled 2013-03-11: qty 2

## 2013-03-11 NOTE — ED Notes (Addendum)
Pt. And family educated on how to care for and empty leg bag. Understanding verbalized.

## 2013-03-11 NOTE — ED Provider Notes (Signed)
TIME SEEN: 8:21 AM  CHIEF COMPLAINT: Urinary retention  HPI: Patient is a 79 year old male with a history of BPH with prior episodes of urinary retention, he, status post pacemaker placement who presents the emergency department with urinary retention since last night. He was last able to urinate approximately 6 PM last night. He is having abdominal discomfort. No fever, nausea, vomiting or diarrhea. No penile discharge, scrotal swelling, testicular pain. No new back pain, numbness or tingling, saddle anesthesia, focal weakness, incontinence. No new medications. Patient/episode of urinary retention was at [facility] 2 years ago. He had a Foley catheter that time and followed up at [facility].  ROS: See HPI Constitutional: no fever  Eyes: no drainage  ENT: no runny nose   Cardiovascular:  no chest pain  Resp: no SOB  GI: no vomiting GU: no dysuria Integumentary: no rash  Allergy: no hives  Musculoskeletal: no leg swelling  Neurological: no slurred speech ROS otherwise negative  PAST MEDICAL HISTORY/PAST SURGICAL HISTORY:  Past Medical History  Diagnosis Date  . ATHEROSCLEROSIS W /INT CLAUDICATION 06/06/2008  . AV BLOCK, COMPLETE 06/06/2008  . BENIGN PROSTATIC HYPERTROPHY, WITH OBSTRUCTION 09/02/2009  . DVT, HX OF 06/04/2009  . GERD 05/16/2007  . HYPERTENSION 05/16/2007  . OSTEOARTHRITIS, KNEES, BILATERAL 07/12/2008  . PACEMAKER, PERMANENT 06/06/2008  . Pruritus   . Decubitus ulcer, buttock   . Decubitus ulcer, lower back   . Atrial fibrillation   . PVD (peripheral vascular disease)   . Barrett esophagus     MEDICATIONS:  Prior to Admission medications   Medication Sig Start Date End Date Taking? Authorizing Provider  aspirin 81 MG tablet Take 81 mg by mouth daily.      Historical Provider, MD  Calcium Carbonate-Vitamin D (CALCIUM-VITAMIN D) 600-200 MG-UNIT CAPS Take 1 capsule by mouth 2 (two) times daily.     Historical Provider, MD  esomeprazole (NEXIUM) 40 MG capsule  Take 1 capsule (40 mg total) by mouth daily before breakfast. 01/16/13   Gordy Savers, MD  furosemide (LASIX) 20 MG tablet Take 1 tablet (20 mg total) by mouth daily. 01/16/13 02/18/14  Gordy Savers, MD  mometasone (NASONEX) 50 MCG/ACT nasal spray Place 2 sprays into the nose daily. As directed 01/16/13   Gordy Savers, MD  Multiple Vitamin (MULTIVITAMIN) tablet Take 1 tablet by mouth daily.      Historical Provider, MD  Polyethyl Glycol-Propyl Glycol (SYSTANE) 0.4-0.3 % SOLN Apply to eye 2 (two) times daily.    Historical Provider, MD  silver sulfADIAZINE (SILVADENE) 1 % cream Apply 1 application topically 2 (two) times daily. 01/16/13   Gordy Savers, MD  tamsulosin (FLOMAX) 0.4 MG CAPS Take 1 capsule (0.4 mg total) by mouth daily after supper. 01/16/13   Gordy Savers, MD    ALLERGIES:  Allergies  Allergen Reactions  . Cortisone Acetate     REACTION: reacted to injection in back    SOCIAL HISTORY:  History  Substance Use Topics  . Smoking status: Former Smoker    Quit date: 07/27/1944  . Smokeless tobacco: Never Used  . Alcohol Use: No    FAMILY HISTORY: No family history on file.  EXAM: CONSTITUTIONAL: Alert and oriented and responds appropriately to questions. Well-appearing; well-nourished HEAD: Normocephalic EYES: Conjunctivae clear, PERRL ENT: normal nose; no rhinorrhea; moist mucous membranes; pharynx without lesions noted NECK: Supple, no meningismus, no LAD  CARD: RRR; S1 and S2 appreciated; no murmurs, no clicks, no rubs, no gallops RESP: Normal chest excursion  without splinting or tachypnea; breath sounds clear and equal bilaterally; no wheezes, no rhonchi, no rales,  ABD/GI: Normal bowel sounds; non-distended; soft, mildly distended, tender in the suprapubic region, no rebound, no guarding BACK:  The back appears normal and is non-tender to palpation, there is no CVA tenderness EXT: Normal ROM in all joints; non-tender to palpation; no  edema; normal capillary refill; no cyanosis    SKIN: Normal color for age and race; warm NEURO: Moves all extremities equally, sensation to light touch intact diffusely PSYCH: The patient's mood and manner are appropriate. Grooming and personal hygiene are appropriate.  MEDICAL DECISION MAKING: Patient with urinary retention likely secondary to BPH. Will place Foley catheter and send urinalysis.  No concern for neurologic causes for urinary retention, dehydration.  ED PROGRESS:  Patient had 41 F coude catheter placed. He had an approximate 100 mL of urine returned. He did have relief of symptoms after catheter placement is now complaining of pain in the shaft of his penis. Likely due to catheter placement. Will give Tylenol and reassess. Urinalysis shows no sign of infection. There is blood in the urine however this is likely due to placement of catheter. Urine culture is pending. Discussed with family that we could remove the catheter today if still uncomfortable but they would like it less than.  11:12 AM  Pt feels much better after Tylenol. We'll instruct him to continue this at home and followup with urology.  Layla Maw Ameila Weldon, DO 03/11/13 1113

## 2013-03-11 NOTE — ED Notes (Addendum)
Pt c/o urinary retention since last night around 11pm.  Pt has a h/o BPH and is currently taking Tamsulosin.  Pt has lower abdominal pain of 8/10.  PCG reports decreased fluid intake.  PCG also reports excessive belching last night.  PCG reports small BM last night and pt denies fevers and N/V.  PCG reports pressure ulcer on right buttock and old pressure ulcer on spine.

## 2013-03-11 NOTE — ED Notes (Signed)
MD at bedside. 

## 2013-03-11 NOTE — ED Notes (Signed)
Bed: WA09 Expected date:  Expected time:  Means of arrival:  Comments: Triage 2 

## 2013-03-11 NOTE — ED Notes (Signed)
Coude supplies at bedside.

## 2013-03-12 LAB — URINE CULTURE

## 2013-04-03 ENCOUNTER — Encounter (HOSPITAL_BASED_OUTPATIENT_CLINIC_OR_DEPARTMENT_OTHER): Payer: Medicare Other | Attending: General Surgery

## 2013-04-03 DIAGNOSIS — L8993 Pressure ulcer of unspecified site, stage 3: Secondary | ICD-10-CM | POA: Insufficient documentation

## 2013-04-03 DIAGNOSIS — L89309 Pressure ulcer of unspecified buttock, unspecified stage: Secondary | ICD-10-CM | POA: Insufficient documentation

## 2013-04-22 ENCOUNTER — Ambulatory Visit (INDEPENDENT_AMBULATORY_CARE_PROVIDER_SITE_OTHER): Payer: Medicare Other | Admitting: Emergency Medicine

## 2013-04-22 VITALS — BP 160/80 | HR 59 | Temp 98.1°F | Resp 20

## 2013-04-22 DIAGNOSIS — R338 Other retention of urine: Secondary | ICD-10-CM

## 2013-04-22 DIAGNOSIS — N3000 Acute cystitis without hematuria: Secondary | ICD-10-CM

## 2013-04-22 LAB — POCT URINALYSIS DIPSTICK
Bilirubin, UA: NEGATIVE
Glucose, UA: NEGATIVE
Ketones, UA: NEGATIVE

## 2013-04-22 MED ORDER — SULFAMETHOXAZOLE-TRIMETHOPRIM 800-160 MG PO TABS: 1.0000 | ORAL_TABLET | Freq: Two times a day (BID) | ORAL | Status: DC

## 2013-04-22 NOTE — Addendum Note (Signed)
Addended by: Carmelina Dane on: 04/22/2013 03:45 PM   Modules accepted: Orders

## 2013-04-22 NOTE — Patient Instructions (Addendum)
Acute Urinary Retention, Male °You have been seen by a caregiver today because of your inability to urinate (pass your water). °This is a common problem in elderly males. As men age their prostates become larger and block the flow of urine from the bladder. This is usually a problem that has come on gradually. It is often first noticed by having to get up at night to urinate. This is because as the prostate enlarges it is more difficult to empty the bladder completely. °Treatment may involve a one time catheterization to empty the bladder. This is putting in a tube to drain your urine. Then you and your personal caregiver can decide at your earliest convenience how to handle this problem in the future. It may also be a problem that may not recur for years. Sometimes this problem can be caused by medications. In this case, all that is often necessary is to discontinue the offending agent. °If you are to leave the foley catheter (a long, narrow, hollow tube) in and go home with a drainage system, you will need to discuss the best course of action with your caregiver. While the catheter is in, maintain a good intake of fluids. Keep the drainage bag emptied and lower than your catheter. This is so contaminated (infected) urine will not be flowing back into your bladder. This could lead to a urinary tract infection. °Only take over-the-counter or prescription medicines for pain, discomfort, or fever as directed by your caregiver.  °SEEK IMMEDIATE MEDICAL CARE IF:  °You develop chills, fever, or show signs of generalized illness that occurs prior to seeing your caregiver. °Document Released: 10/19/2000 Document Revised: 10/05/2011 Document Reviewed: 07/04/2008 °Exit[employer]® Patient Information ©2014 [employer]. ° °

## 2013-04-22 NOTE — Progress Notes (Addendum)
[facility] [address] [place] [address] [phone]  Date:  04/22/2013   Name:  [name] [name]   DOB:  [birth year]   MRN:  [id number]  PCP:  Rogelia Boga, MD    Chief Complaint: trouble urinating started this morning at 8 am   History of Present Illness:  [name] [name] is a 79 y.o. very pleasant male patient who presents with the following:  History of BPH treated with flomax.  Had prior episode of urine retention and was cathed 2 years ago and again last month..  Has been unable to urinate and has marked pain since last urination around 0630 which was a small amount and again a few drops around 1030.  No fever or chills, no new medications.  Followed by urology.  Patient Active Problem List   Diagnosis Date Noted  . Lower extremity edema 11/07/2010  . Atrial fibrillation 10/30/2010  . BENIGN PROSTATIC HYPERTROPHY, WITH OBSTRUCTION 09/02/2009  . DVT, HX OF 06/04/2009  . OSTEOARTHRITIS, KNEES, BILATERAL 07/12/2008  . AV BLOCK, COMPLETE 06/06/2008  . ATHEROSCLEROSIS W /INT CLAUDICATION 06/06/2008  . PACEMAKER, PERMANENT 06/06/2008  . HYPERTENSION 05/16/2007  . GERD 05/16/2007    Past Medical History  Diagnosis Date  . ATHEROSCLEROSIS W /INT CLAUDICATION 06/06/2008  . AV BLOCK, COMPLETE 06/06/2008  . BENIGN PROSTATIC HYPERTROPHY, WITH OBSTRUCTION 09/02/2009  . DVT, HX OF 06/04/2009  . GERD 05/16/2007  . HYPERTENSION 05/16/2007  . OSTEOARTHRITIS, KNEES, BILATERAL 07/12/2008  . PACEMAKER, PERMANENT 06/06/2008  . Pruritus   . Decubitus ulcer, buttock   . Decubitus ulcer, lower back   . Atrial fibrillation   . PVD (peripheral vascular disease)   . Barrett esophagus     Past Surgical History  Procedure Laterality Date  . Cataract extraction    . Skin graft      to heel  . Hip fracture surgery    . Pacemaker insertion    . Tonsillectomy      History  Substance Use Topics  . Smoking status: Former Smoker    Quit date:  07/27/1944  . Smokeless tobacco: Never Used  . Alcohol Use: No    No family history on file.  Allergies  Allergen Reactions  . Cortisone Acetate     REACTION: reacted to injection in back    Medication list has been reviewed and updated.  Current Outpatient Prescriptions on File Prior to Visit  Medication Sig Dispense Refill  . aspirin 81 MG tablet Take 81 mg by mouth daily.        . Calcium Carbonate-Vitamin D (CALCIUM-VITAMIN D) 600-200 MG-UNIT CAPS Take 1 capsule by mouth 2 (two) times daily.       Marland Kitchen esomeprazole (NEXIUM) 40 MG capsule Take 1 capsule (40 mg total) by mouth daily before breakfast.  90 capsule  3  . furosemide (LASIX) 20 MG tablet Take 1 tablet (20 mg total) by mouth daily.  90 tablet  3  . mometasone (NASONEX) 50 MCG/ACT nasal spray Place 2 sprays into the nose daily. As directed  51 g  1  . Multiple Vitamin (MULTIVITAMIN) tablet Take 1 tablet by mouth daily.        . multivitamin-lutein (OCUVITE-LUTEIN) CAPS capsule Take 1 capsule by mouth daily.      Bertram Gala Glycol-Propyl Glycol (SYSTANE) 0.4-0.3 % SOLN Apply to eye 2 (two) times daily.      . tamsulosin (FLOMAX) 0.4 MG CAPS Take 1 capsule (0.4 mg total) by  mouth daily after supper.  90 capsule  4  . Vitamins A & D (VITAMIN A & D) ointment Apply 1 application topically daily as needed for dry skin.      . Menthol-Zinc Oxide (CALMOSEPTINE) 0.44-20.625 % OINT Apply 1 application topically 2 (two) times daily.       No current facility-administered medications on file prior to visit.    Review of Systems:  As per HPI, otherwise negative.    Physical Examination: Filed Vitals:   04/22/13 1449  BP: 160/80  Pulse: 59  Temp: 98.1 F (36.7 C)  Resp: 20   There were no vitals filed for this visit. There is no weight on file to calculate BMI. Ideal Body Weight:    GEN: WDWN, NAD, Non-toxic, A & O x 3 HEENT: Atraumatic, Normocephalic. Neck supple. No masses, No LAD. Ears and Nose: No external  deformity. CV: RRR, No M/G/R. No JVD. No thrill. No extra heart sounds. PULM: CTA B, no wheezes, crackles, rhonchi. No retractions. No resp. distress. No accessory muscle use. ABD: S, tender over bladder, ND, +BS. No rebound. No HSM. EXTR: No c/c/e NEURO Normal gait.  PSYCH: Normally interactive. Conversant. Not depressed or anxious appearing.  Calm demeanor.    Assessment and Plan: Acute urinary retention BPH Urology consultation Home with foley  (initial drainage over 600 ml)   Signed,  Phillips Odor, MD   Results for orders placed in visit on 04/22/13  POCT URINALYSIS DIPSTICK      Result Value Range   Color, UA yellow     Clarity, UA clear     Glucose, UA neg     Bilirubin, UA neg     Ketones, UA neg     Spec Grav, UA 1.015     Blood, UA small     pH, UA 6.0     Protein, UA neg     Urobilinogen, UA 0.2     Nitrite, UA neg     Leukocytes, UA Trace

## 2013-04-26 ENCOUNTER — Telehealth: Payer: Self-pay | Admitting: Internal Medicine

## 2013-04-26 ENCOUNTER — Telehealth: Payer: Self-pay

## 2013-04-26 NOTE — Telephone Encounter (Deleted)
Bonita Quin from [facility] calledand wanted to talk to you - Mr Krise is constipated and he fell yesterday please call her back at [phone]

## 2013-04-26 NOTE — Telephone Encounter (Signed)
Linda from [facility] calledand wanted to talk to you - [name] is constipated and he fell yesterday please call her back at [phone] 

## 2013-04-26 NOTE — Telephone Encounter (Signed)
Linda from [facility] calledand wanted to talk to you - Mr Sheehan is constipated and he fell yesterday please call her back at [phone] 

## 2013-04-26 NOTE — Telephone Encounter (Deleted)
Linda from [facility] calledand wanted to talk to you - Mr [name] [name] is constipated and he fell yesterday please call her back at [phone] 

## 2013-04-27 NOTE — Telephone Encounter (Signed)
Tried to call pt no answer left message on machine to call office.

## 2013-04-27 NOTE — Telephone Encounter (Signed)
Called Linda at  number listed left message to call office.

## 2013-04-28 NOTE — Telephone Encounter (Signed)
Spoke to pt's wife Alvira Philips she said the Mag Citrate worked and pt had a large explosive bowel movement and liquid stool too. Told Alvira Philips okay now continue Miralax and stool softner daily and make sure pt is drinking. Alvira Philips verbalized understanding. Told her he does not have to have a bowel movement everyday because he is not eating much according to [place] [facility] care. Alvira Philips said yes that is true he is not eating much. Told her okay, call if any problems. Alvira Philips verbalized understanding.

## 2013-04-28 NOTE — Telephone Encounter (Signed)
Wife returned your call and can be reached in the home this am.

## 2013-04-28 NOTE — Telephone Encounter (Signed)
Bonita Quin from [facility] care called back and said that pt has had a bowel movement, he moved his bowels on Wed and this morning but small amount. She said pt is not eating well and still expects to have a bowel movement everyday. Bonita Quin said she told pt and wife that it is okay not to move bowels everyday due to not eating. She said she was with pt an hour ago and that he drank 1/2 the Mag Citrate and that the wife is suppose to call me later. Told her okay thanks for the update.

## 2013-04-28 NOTE — Telephone Encounter (Signed)
Spoke to pt's wife asked her how he was doing? [name] [name] said okay except has not moved bowels in a week except for tiny bit. Asked her if he is taking Miralax and stool softners.? [name] [name] said pt is taking Miralax daily, not stool softners. Asked if pt having pain? She said no. Told [name] [name] need to try Mag Citrate OTC and stool softners can get them at the pharmacy. Told her need to get bowels moving, try Mag Citrate today if no bowel movement by end of day need to call me back. [name] [name] verbalized understanding.

## 2013-05-01 ENCOUNTER — Encounter (HOSPITAL_BASED_OUTPATIENT_CLINIC_OR_DEPARTMENT_OTHER): Payer: Medicare Other | Attending: General Surgery

## 2013-05-01 DIAGNOSIS — L89309 Pressure ulcer of unspecified buttock, unspecified stage: Secondary | ICD-10-CM | POA: Insufficient documentation

## 2013-05-01 DIAGNOSIS — L8993 Pressure ulcer of unspecified site, stage 3: Secondary | ICD-10-CM | POA: Insufficient documentation

## 2013-05-05 ENCOUNTER — Telehealth: Payer: Self-pay | Admitting: Internal Medicine

## 2013-05-05 NOTE — Telephone Encounter (Signed)
Pls advise.  

## 2013-05-05 NOTE — Telephone Encounter (Signed)
Spoke with nancy

## 2013-05-05 NOTE — Telephone Encounter (Signed)
Continue same Double or triple the use of MiraLax until results

## 2013-05-05 NOTE — Telephone Encounter (Signed)
Home Care nurse called. Patient has been constipated x5 days. Miralax and prune juice, coffee. No results. OTC treatments suggested, but not done yet. Please advise re possible rx. [facility]. Patient is eating fair, but not drinking enough fluids. Call the home or the nurse. Nurse will see them again Monday morning.

## 2013-05-11 ENCOUNTER — Encounter: Payer: Self-pay | Admitting: Internal Medicine

## 2013-05-18 ENCOUNTER — Telehealth: Payer: Self-pay | Admitting: Internal Medicine

## 2013-05-18 NOTE — Telephone Encounter (Signed)
Routed to triage 

## 2013-05-18 NOTE — Telephone Encounter (Signed)
Wife calling to ask if patient should be on Vitamin C or Zinc per recommendation of Home Health nurse to promote healing of wound she cares for,requested note be sent to MD for request

## 2013-05-19 NOTE — Telephone Encounter (Signed)
Multivitamin with Calcium and vitamin D supplementation adequate

## 2013-05-19 NOTE — Telephone Encounter (Signed)
Spoke to pt's wife Alvira Philips told her Multivitamin with Calcium and vitamin D supplementation adequate does not need to add more vitamins per Dr. Frederica Kuster. Alvira Philips verbalized understanding.

## 2013-05-19 NOTE — Telephone Encounter (Signed)
Dr. Kirtland Bouchard, please see message and advise. Pt is already taking Multi Vit, calcium and vit d.

## 2013-05-29 ENCOUNTER — Encounter (HOSPITAL_BASED_OUTPATIENT_CLINIC_OR_DEPARTMENT_OTHER): Payer: Medicare Other | Attending: General Surgery

## 2013-05-29 DIAGNOSIS — L89309 Pressure ulcer of unspecified buttock, unspecified stage: Secondary | ICD-10-CM | POA: Insufficient documentation

## 2013-05-29 DIAGNOSIS — L8993 Pressure ulcer of unspecified site, stage 3: Secondary | ICD-10-CM | POA: Insufficient documentation

## 2013-06-06 ENCOUNTER — Telehealth: Payer: Self-pay | Admitting: Internal Medicine

## 2013-06-06 NOTE — Telephone Encounter (Signed)
Spoke with Dr. Ladona Ridgel who gave written order ok for patient to stop ASA for 5 days.  I called and spoke with the patient's daughter who states her mother is out running an errand and she will have her mother call back if order needs to be sent to urologist.

## 2013-06-06 NOTE — Telephone Encounter (Signed)
New message     Have foley cather in place and now he has blood in urine.  Urologist said for him to stop asa for 5 days and see if blood goes away. Can he stop the asa?

## 2013-06-26 ENCOUNTER — Encounter (HOSPITAL_BASED_OUTPATIENT_CLINIC_OR_DEPARTMENT_OTHER): Payer: Medicare Other | Attending: General Surgery

## 2013-06-26 DIAGNOSIS — L89309 Pressure ulcer of unspecified buttock, unspecified stage: Secondary | ICD-10-CM | POA: Insufficient documentation

## 2013-06-26 DIAGNOSIS — L8993 Pressure ulcer of unspecified site, stage 3: Secondary | ICD-10-CM | POA: Insufficient documentation

## 2013-06-27 ENCOUNTER — Telehealth: Payer: Self-pay | Admitting: Internal Medicine

## 2013-06-27 NOTE — Telephone Encounter (Signed)
Fyi: Pt fell sun night at midnight. bruising on right temple and right eye. Pt is ok, no pain, swelling, headaches or problems. Will continue to moniter. Mr walker is doing wound care on backside and bottom for pt. Will see pt again on wed.[name] [name]

## 2013-06-27 NOTE — Telephone Encounter (Signed)
FYI, see message below

## 2013-08-07 ENCOUNTER — Encounter (HOSPITAL_BASED_OUTPATIENT_CLINIC_OR_DEPARTMENT_OTHER): Payer: Medicare Other | Attending: General Surgery

## 2013-08-07 DIAGNOSIS — R21 Rash and other nonspecific skin eruption: Secondary | ICD-10-CM | POA: Insufficient documentation

## 2013-08-07 DIAGNOSIS — L89309 Pressure ulcer of unspecified buttock, unspecified stage: Secondary | ICD-10-CM | POA: Insufficient documentation

## 2013-08-07 DIAGNOSIS — L8993 Pressure ulcer of unspecified site, stage 3: Secondary | ICD-10-CM | POA: Insufficient documentation

## 2013-08-10 ENCOUNTER — Encounter: Payer: Self-pay | Admitting: Internal Medicine

## 2013-08-10 DIAGNOSIS — I442 Atrioventricular block, complete: Secondary | ICD-10-CM

## 2013-08-15 ENCOUNTER — Telehealth: Payer: Self-pay | Admitting: Internal Medicine

## 2013-08-15 NOTE — Telephone Encounter (Signed)
[facility] requesting new script for mometasone (NASONEX) 50 MCG/ACT nasal spray

## 2013-08-16 MED ORDER — MOMETASONE FUROATE 50 MCG/ACT NA SUSP: 2.0000 | Freq: Every day | NASAL | Status: AC

## 2013-08-16 NOTE — Telephone Encounter (Signed)
Rx sent to [facility].   

## 2013-09-04 ENCOUNTER — Encounter (HOSPITAL_BASED_OUTPATIENT_CLINIC_OR_DEPARTMENT_OTHER): Payer: Medicare Other | Attending: General Surgery

## 2013-09-04 DIAGNOSIS — L89309 Pressure ulcer of unspecified buttock, unspecified stage: Secondary | ICD-10-CM | POA: Insufficient documentation

## 2013-09-04 DIAGNOSIS — L8993 Pressure ulcer of unspecified site, stage 3: Secondary | ICD-10-CM | POA: Insufficient documentation

## 2013-10-02 ENCOUNTER — Encounter (HOSPITAL_BASED_OUTPATIENT_CLINIC_OR_DEPARTMENT_OTHER): Payer: Medicare Other | Attending: General Surgery

## 2013-10-02 DIAGNOSIS — L8993 Pressure ulcer of unspecified site, stage 3: Secondary | ICD-10-CM | POA: Insufficient documentation

## 2013-10-02 DIAGNOSIS — L89309 Pressure ulcer of unspecified buttock, unspecified stage: Secondary | ICD-10-CM | POA: Insufficient documentation

## 2013-10-03 NOTE — Progress Notes (Signed)
[facility]  NAME:  [name] [name], [name] [name]                ACCOUNT NO.:  [id number]  MEDICAL RECORD NO.:  [id number]      DATE OF BIRTH:  [birth year]  PHYSICIAN:  [name] Nasira Janusz, MD    VISIT DATE:  10/02/2013                                  OFFICE VISIT   CHIEF COMPLAINT:  Chronic right ischial pressure ulcer, stage III.  HISTORY OF PRESENT ILLNESS:  This patient is a 80 year old male accompanied by his wife who is a former nurse for followup of right ischial pressure ulceration that has been present for over a year.  His current wound care is collagen with foam dressing, he follows here monthly. Review of the chart indicates there is no recent nutrition evaluation. He does spent most of his time in bed.  He does have onlay mattress.  He is minimally ambulatory.  He has a chronic indwelling Foley catheter due to prostate enlargement.  He is not currently taking any protein supplementation.  On examination, blood pressure is 116/71, respirations 18, pulse 65, temperature is 97.5.  Right ischial ulceration is clean, measured at 1.4 x 1 x 0.2 cm.  There is small amount of slough present.  After application of topical anesthetic, the slough was removed with Q-Tip. There is evidence of neo-epithelialization around the wound edges. However, this is slow progress over the last month.  ASSESSMENT:  We will continue with collagen and foam dressing 3 times weekly.  He does have home health and prealbumin was ordered.  We reviewed his protein supplementation and the patient does have Ensure at home and asked that he restart this with 1 can at least daily.  We also discussed supplementation with Whey protein.  Plan for follow up in 1 month's time.          ______________________________ [name] Jakhiya Brower, MD     BT/MEDQ  D:  10/02/2013  T:  10/03/2013  Job:  [id number]

## 2013-10-28 ENCOUNTER — Emergency Department (HOSPITAL_COMMUNITY): Payer: Medicare Other

## 2013-10-28 ENCOUNTER — Encounter (HOSPITAL_COMMUNITY): Payer: Self-pay | Admitting: Emergency Medicine

## 2013-10-28 ENCOUNTER — Inpatient Hospital Stay (HOSPITAL_COMMUNITY)
Admission: EM | Admit: 2013-10-28 | Discharge: 2013-11-02 | DRG: 871 | Disposition: A | Discharge status: Skilled Nursing Facility | Payer: Medicare Other | Attending: Internal Medicine | Admitting: Internal Medicine

## 2013-10-28 DIAGNOSIS — F0390 Unspecified dementia without behavioral disturbance: Secondary | ICD-10-CM | POA: Diagnosis present

## 2013-10-28 DIAGNOSIS — L89109 Pressure ulcer of unspecified part of back, unspecified stage: Secondary | ICD-10-CM | POA: Diagnosis present

## 2013-10-28 DIAGNOSIS — I70219 Atherosclerosis of native arteries of extremities with intermittent claudication, unspecified extremity: Secondary | ICD-10-CM

## 2013-10-28 DIAGNOSIS — I739 Peripheral vascular disease, unspecified: Secondary | ICD-10-CM | POA: Diagnosis present

## 2013-10-28 DIAGNOSIS — L89209 Pressure ulcer of unspecified hip, unspecified stage: Secondary | ICD-10-CM | POA: Diagnosis present

## 2013-10-28 DIAGNOSIS — R197 Diarrhea, unspecified: Secondary | ICD-10-CM

## 2013-10-28 DIAGNOSIS — L89899 Pressure ulcer of other site, unspecified stage: Secondary | ICD-10-CM | POA: Diagnosis present

## 2013-10-28 DIAGNOSIS — R636 Underweight: Secondary | ICD-10-CM | POA: Diagnosis present

## 2013-10-28 DIAGNOSIS — J9600 Acute respiratory failure, unspecified whether with hypoxia or hypercapnia: Secondary | ICD-10-CM | POA: Diagnosis present

## 2013-10-28 DIAGNOSIS — L8990 Pressure ulcer of unspecified site, unspecified stage: Secondary | ICD-10-CM

## 2013-10-28 DIAGNOSIS — I359 Nonrheumatic aortic valve disorder, unspecified: Secondary | ICD-10-CM | POA: Diagnosis present

## 2013-10-28 DIAGNOSIS — R6520 Severe sepsis without septic shock: Secondary | ICD-10-CM

## 2013-10-28 DIAGNOSIS — J9601 Acute respiratory failure with hypoxia: Secondary | ICD-10-CM

## 2013-10-28 DIAGNOSIS — N400 Enlarged prostate without lower urinary tract symptoms: Secondary | ICD-10-CM | POA: Diagnosis present

## 2013-10-28 DIAGNOSIS — I10 Essential (primary) hypertension: Secondary | ICD-10-CM | POA: Diagnosis present

## 2013-10-28 DIAGNOSIS — J189 Pneumonia, unspecified organism: Secondary | ICD-10-CM

## 2013-10-28 DIAGNOSIS — Z950 Presence of cardiac pacemaker: Secondary | ICD-10-CM

## 2013-10-28 DIAGNOSIS — N179 Acute kidney failure, unspecified: Secondary | ICD-10-CM

## 2013-10-28 DIAGNOSIS — I509 Heart failure, unspecified: Secondary | ICD-10-CM | POA: Diagnosis present

## 2013-10-28 DIAGNOSIS — A419 Sepsis, unspecified organism: Principal | ICD-10-CM

## 2013-10-28 DIAGNOSIS — I442 Atrioventricular block, complete: Secondary | ICD-10-CM

## 2013-10-28 DIAGNOSIS — Z993 Dependence on wheelchair: Secondary | ICD-10-CM

## 2013-10-28 DIAGNOSIS — I4891 Unspecified atrial fibrillation: Secondary | ICD-10-CM

## 2013-10-28 DIAGNOSIS — A4902 Methicillin resistant Staphylococcus aureus infection, unspecified site: Secondary | ICD-10-CM

## 2013-10-28 DIAGNOSIS — E860 Dehydration: Secondary | ICD-10-CM

## 2013-10-28 DIAGNOSIS — Z22322 Carrier or suspected carrier of Methicillin resistant Staphylococcus aureus: Secondary | ICD-10-CM

## 2013-10-28 DIAGNOSIS — I959 Hypotension, unspecified: Secondary | ICD-10-CM

## 2013-10-28 DIAGNOSIS — Z66 Do not resuscitate: Secondary | ICD-10-CM | POA: Diagnosis present

## 2013-10-28 DIAGNOSIS — R404 Transient alteration of awareness: Secondary | ICD-10-CM | POA: Diagnosis present

## 2013-10-28 DIAGNOSIS — N289 Disorder of kidney and ureter, unspecified: Secondary | ICD-10-CM

## 2013-10-28 DIAGNOSIS — Z86718 Personal history of other venous thrombosis and embolism: Secondary | ICD-10-CM

## 2013-10-28 DIAGNOSIS — Z79899 Other long term (current) drug therapy: Secondary | ICD-10-CM

## 2013-10-28 DIAGNOSIS — F03918 Unspecified dementia, unspecified severity, with other behavioral disturbance: Secondary | ICD-10-CM

## 2013-10-28 DIAGNOSIS — R410 Disorientation, unspecified: Secondary | ICD-10-CM

## 2013-10-28 DIAGNOSIS — L89309 Pressure ulcer of unspecified buttock, unspecified stage: Secondary | ICD-10-CM | POA: Diagnosis present

## 2013-10-28 DIAGNOSIS — I350 Nonrheumatic aortic (valve) stenosis: Secondary | ICD-10-CM | POA: Diagnosis present

## 2013-10-28 DIAGNOSIS — I5032 Chronic diastolic (congestive) heart failure: Secondary | ICD-10-CM | POA: Diagnosis present

## 2013-10-28 DIAGNOSIS — E43 Unspecified severe protein-calorie malnutrition: Secondary | ICD-10-CM

## 2013-10-28 DIAGNOSIS — K219 Gastro-esophageal reflux disease without esophagitis: Secondary | ICD-10-CM | POA: Diagnosis present

## 2013-10-28 DIAGNOSIS — L8992 Pressure ulcer of unspecified site, stage 2: Secondary | ICD-10-CM | POA: Diagnosis present

## 2013-10-28 DIAGNOSIS — F0391 Unspecified dementia with behavioral disturbance: Secondary | ICD-10-CM

## 2013-10-28 DIAGNOSIS — N390 Urinary tract infection, site not specified: Secondary | ICD-10-CM

## 2013-10-28 HISTORY — DX: Unspecified severe protein-calorie malnutrition: E43

## 2013-10-28 HISTORY — DX: Carrier or suspected carrier of methicillin resistant Staphylococcus aureus: Z22.322

## 2013-10-28 HISTORY — DX: Nonrheumatic aortic (valve) stenosis: I35.0

## 2013-10-28 HISTORY — DX: Unspecified dementia with behavioral disturbance: F03.91

## 2013-10-28 HISTORY — DX: Underweight: R63.6

## 2013-10-28 LAB — COMPREHENSIVE METABOLIC PANEL
ALBUMIN: 2.6 g/dL — AB (ref 3.5–5.2)
ALT: 9 U/L (ref 0–53)
AST: 14 U/L (ref 0–37)
Alkaline Phosphatase: 81 U/L (ref 39–117)
BILIRUBIN TOTAL: 0.9 mg/dL (ref 0.3–1.2)
BUN: 41 mg/dL — AB (ref 6–23)
CHLORIDE: 101 meq/L (ref 96–112)
CO2: 28 mEq/L (ref 19–32)
CREATININE: 1.66 mg/dL — AB (ref 0.50–1.35)
Calcium: 9.6 mg/dL (ref 8.4–10.5)
GFR calc Af Amer: 39 mL/min — ABNORMAL LOW (ref >90)
GFR calc non Af Amer: 34 mL/min — ABNORMAL LOW (ref >90)
Glucose, Bld: 121 mg/dL — ABNORMAL HIGH (ref 70–99)
Potassium: 4.2 mEq/L (ref 3.7–5.3)
Sodium: 142 mEq/L (ref 137–147)
TOTAL PROTEIN: 6.4 g/dL (ref 6.0–8.3)

## 2013-10-28 LAB — MRSA PCR SCREENING: MRSA BY PCR: POSITIVE — AB

## 2013-10-28 LAB — CBC WITH DIFFERENTIAL/PLATELET
BASOS ABS: 0 10*3/uL (ref 0.0–0.1)
BASOS PCT: 0 % (ref 0–1)
EOS ABS: 0 10*3/uL (ref 0.0–0.7)
Eosinophils Relative: 0 % (ref 0–5)
HCT: 38.9 % — ABNORMAL LOW (ref 39.0–52.0)
Hemoglobin: 12.4 g/dL — ABNORMAL LOW (ref 13.0–17.0)
Lymphocytes Relative: 5 % — ABNORMAL LOW (ref 12–46)
Lymphs Abs: 0.7 10*3/uL (ref 0.7–4.0)
MCH: 31.7 pg (ref 26.0–34.0)
MCHC: 31.9 g/dL (ref 30.0–36.0)
MCV: 99.5 fL (ref 78.0–100.0)
MONO ABS: 0.9 10*3/uL (ref 0.1–1.0)
MONOS PCT: 7 % (ref 3–12)
NEUTROS ABS: 11.7 10*3/uL — AB (ref 1.7–7.7)
NEUTROS PCT: 88 % — AB (ref 43–77)
Platelets: 169 10*3/uL (ref 150–400)
RBC: 3.91 MIL/uL — ABNORMAL LOW (ref 4.22–5.81)
RDW: 16.6 % — AB (ref 11.5–15.5)
WBC: 13.3 10*3/uL — ABNORMAL HIGH (ref 4.0–10.5)

## 2013-10-28 LAB — URINALYSIS, ROUTINE W REFLEX MICROSCOPIC
GLUCOSE, UA: NEGATIVE mg/dL
Ketones, ur: NEGATIVE mg/dL
NITRITE: NEGATIVE
PH: 5 (ref 5.0–8.0)
Protein, ur: 30 mg/dL — AB
SPECIFIC GRAVITY, URINE: 1.026 (ref 1.005–1.030)
Urobilinogen, UA: 1 mg/dL (ref 0.0–1.0)

## 2013-10-28 LAB — I-STAT CG4 LACTIC ACID, ED: LACTIC ACID, VENOUS: 3.75 mmol/L — AB (ref 0.5–2.2)

## 2013-10-28 LAB — EXPECTORATED SPUTUM ASSESSMENT W GRAM STAIN, RFLX TO RESP C

## 2013-10-28 LAB — PHOSPHORUS: Phosphorus: 4 mg/dL (ref 2.3–4.6)

## 2013-10-28 LAB — URINE MICROSCOPIC-ADD ON

## 2013-10-28 LAB — MAGNESIUM: Magnesium: 2.1 mg/dL (ref 1.5–2.5)

## 2013-10-28 LAB — EXPECTORATED SPUTUM ASSESSMENT W REFEX TO RESP CULTURE

## 2013-10-28 MED ORDER — ONDANSETRON HCL 4 MG/2ML IJ SOLN: 4.0000 mg | Freq: Four times a day (QID) | INTRAMUSCULAR | Status: DC | PRN

## 2013-10-28 MED ORDER — DEXTROSE 5 % IV SOLN: 1.0000 g | INTRAVENOUS | Status: DC

## 2013-10-28 MED ORDER — ONDANSETRON HCL 4 MG PO TABS: 4.0000 mg | ORAL_TABLET | Freq: Four times a day (QID) | ORAL | Status: DC | PRN

## 2013-10-28 MED ORDER — AZITHROMYCIN 500 MG IV SOLR
500.0000 mg | Freq: Once | INTRAVENOUS | Status: AC
  Administered 2013-10-28: 500 mg via INTRAVENOUS

## 2013-10-28 MED ORDER — KCL IN DEXTROSE-NACL 20-5-0.45 MEQ/L-%-% IV SOLN
INTRAVENOUS | Status: DC
  Administered 2013-10-28 – 2013-10-30 (×3): via INTRAVENOUS
  Filled 2013-10-28 (×10): qty 1000

## 2013-10-28 MED ORDER — MUPIROCIN 2 % EX OINT
1.0000 "application " | TOPICAL_OINTMENT | Freq: Two times a day (BID) | CUTANEOUS | Status: DC
  Administered 2013-10-28 – 2013-11-01 (×8): 1 via NASAL
  Filled 2013-10-28: qty 22

## 2013-10-28 MED ORDER — LEVALBUTEROL HCL 0.63 MG/3ML IN NEBU: 0.6300 mg | INHALATION_SOLUTION | Freq: Four times a day (QID) | RESPIRATORY_TRACT | Status: DC | PRN

## 2013-10-28 MED ORDER — POLYVINYL ALCOHOL 1.4 % OP SOLN
1.0000 [drp] | Freq: Two times a day (BID) | OPHTHALMIC | Status: DC
  Administered 2013-10-28 – 2013-11-01 (×7): 1 [drp] via OPHTHALMIC
  Filled 2013-10-28: qty 15

## 2013-10-28 MED ORDER — ACETAMINOPHEN 325 MG PO TABS
650.0000 mg | ORAL_TABLET | Freq: Four times a day (QID) | ORAL | Status: DC | PRN
  Administered 2013-10-31: 650 mg via ORAL
  Filled 2013-10-28: qty 2

## 2013-10-28 MED ORDER — SODIUM CHLORIDE 0.9 % IV BOLUS (SEPSIS)
1000.0000 mL | Freq: Once | INTRAVENOUS | Status: AC
  Administered 2013-10-28: 1000 mL via INTRAVENOUS

## 2013-10-28 MED ORDER — CHLORHEXIDINE GLUCONATE CLOTH 2 % EX PADS
6.0000 | MEDICATED_PAD | Freq: Every day | CUTANEOUS | Status: AC
  Administered 2013-10-29 – 2013-11-02 (×5): 6 via TOPICAL

## 2013-10-28 MED ORDER — SODIUM CHLORIDE 0.9 % IV SOLN
1000.0000 mL | Freq: Once | INTRAVENOUS | Status: AC
  Administered 2013-10-28: 1000 mL via INTRAVENOUS

## 2013-10-28 MED ORDER — HEPARIN SODIUM (PORCINE) 5000 UNIT/ML IJ SOLN
5000.0000 [IU] | Freq: Three times a day (TID) | INTRAMUSCULAR | Status: DC
  Administered 2013-10-28 – 2013-11-02 (×11): 5000 [IU] via SUBCUTANEOUS
  Filled 2013-10-28 (×17): qty 1

## 2013-10-28 MED ORDER — OXYBUTYNIN CHLORIDE 5 MG PO TABS
5.0000 mg | ORAL_TABLET | Freq: Three times a day (TID) | ORAL | Status: DC
  Administered 2013-10-28 – 2013-10-29 (×3): 5 mg via ORAL
  Filled 2013-10-28 (×5): qty 1

## 2013-10-28 MED ORDER — PNEUMOCOCCAL VAC POLYVALENT 25 MCG/0.5ML IJ INJ
0.5000 mL | INJECTION | INTRAMUSCULAR | Status: DC
  Filled 2013-10-28 (×2): qty 0.5

## 2013-10-28 MED ORDER — OCUVITE-LUTEIN PO CAPS
1.0000 | ORAL_CAPSULE | Freq: Every day | ORAL | Status: DC
  Administered 2013-10-28 – 2013-11-01 (×4): 1 via ORAL
  Filled 2013-10-28 (×6): qty 1

## 2013-10-28 MED ORDER — SODIUM CHLORIDE 0.9 % IJ SOLN
3.0000 mL | Freq: Two times a day (BID) | INTRAMUSCULAR | Status: DC
  Administered 2013-10-30 (×2): 3 mL via INTRAVENOUS

## 2013-10-28 MED ORDER — ALBUTEROL SULFATE (2.5 MG/3ML) 0.083% IN NEBU: 2.5000 mg | INHALATION_SOLUTION | Freq: Four times a day (QID) | RESPIRATORY_TRACT | Status: DC | PRN

## 2013-10-28 MED ORDER — VANCOMYCIN HCL IN DEXTROSE 750-5 MG/150ML-% IV SOLN
750.0000 mg | INTRAVENOUS | Status: DC
  Administered 2013-10-30 – 2013-10-31 (×2): 750 mg via INTRAVENOUS
  Filled 2013-10-28 (×5): qty 150

## 2013-10-28 MED ORDER — SODIUM CHLORIDE 0.9 % IV SOLN
1000.0000 mL | INTRAVENOUS | Status: DC
  Administered 2013-10-28: 1000 mL via INTRAVENOUS

## 2013-10-28 MED ORDER — PANTOPRAZOLE SODIUM 40 MG PO TBEC
40.0000 mg | DELAYED_RELEASE_TABLET | Freq: Every day | ORAL | Status: DC
  Administered 2013-10-28 – 2013-11-01 (×4): 40 mg via ORAL
  Filled 2013-10-28 (×6): qty 1

## 2013-10-28 MED ORDER — PIPERACILLIN-TAZOBACTAM 3.375 G IVPB
3.3750 g | Freq: Three times a day (TID) | INTRAVENOUS | Status: DC
  Administered 2013-10-28 – 2013-11-02 (×12): 3.375 g via INTRAVENOUS
  Filled 2013-10-28 (×17): qty 50

## 2013-10-28 MED ORDER — SODIUM CHLORIDE 0.9 % IV SOLN
INTRAVENOUS | Status: AC
  Administered 2013-10-28: 18:00:00 via INTRAVENOUS

## 2013-10-28 MED ORDER — DEXTROSE 5 % IV SOLN
1.0000 g | Freq: Once | INTRAVENOUS | Status: AC
  Administered 2013-10-28: 1 g via INTRAVENOUS
  Filled 2013-10-28: qty 10

## 2013-10-28 MED ORDER — ASPIRIN 81 MG PO CHEW
81.0000 mg | CHEWABLE_TABLET | Freq: Every day | ORAL | Status: DC
  Administered 2013-10-28 – 2013-11-01 (×4): 81 mg via ORAL
  Filled 2013-10-28 (×6): qty 1

## 2013-10-28 MED ORDER — ADULT MULTIVITAMIN W/MINERALS CH
1.0000 | ORAL_TABLET | Freq: Every day | ORAL | Status: DC
  Administered 2013-10-28 – 2013-11-01 (×4): 1 via ORAL
  Filled 2013-10-28 (×6): qty 1

## 2013-10-28 MED ORDER — POLYETHYL GLYCOL-PROPYL GLYCOL 0.4-0.3 % OP SOLN: Freq: Two times a day (BID) | OPHTHALMIC | Status: DC

## 2013-10-28 MED ORDER — DEXTROSE 5 % IV SOLN: 500.0000 mg | INTRAVENOUS | Status: DC

## 2013-10-28 MED ORDER — VANCOMYCIN HCL 10 G IV SOLR
1250.0000 mg | Freq: Once | INTRAVENOUS | Status: AC
  Administered 2013-10-28: 1250 mg via INTRAVENOUS
  Filled 2013-10-28: qty 1250

## 2013-10-28 NOTE — Progress Notes (Signed)
ANTIBIOTIC CONSULT NOTE - Follow up  Pharmacy Consult for Vancomycin, Zosyn Indication: Sepsis  Allergies  Allergen Reactions  . Cortisone Acetate     REACTION: reacted to injection in back    Patient Measurements:   Last documented weight 62.6kg (03/11/13).  Updated ht/wt ordered and pending entry.  Vital Signs: Temp: 98.4 F (36.9 C) (04/04 1626) Temp src: Other (Comment) (04/04 1534) BP: 91/43 mmHg (04/04 1620) Pulse Rate: 74 (04/04 1607)  Labs:  Recent Labs  10/28/13 1252  WBC 13.3*  HGB 12.4*  PLT 169  CREATININE 1.66*   The CrCl is unknown because both a height and weight (above a minimum accepted value) are required for this calculation.   Microbiology: No results found for this or any previous visit (from the past 720 hour(s)).  Medical History: Past Medical History  Diagnosis Date  . ATHEROSCLEROSIS W /INT CLAUDICATION 06/06/2008  . AV BLOCK, COMPLETE 06/06/2008  . BENIGN PROSTATIC HYPERTROPHY, WITH OBSTRUCTION 09/02/2009  . DVT, HX OF 06/04/2009  . GERD 05/16/2007  . HYPERTENSION 05/16/2007  . OSTEOARTHRITIS, KNEES, BILATERAL 07/12/2008  . PACEMAKER, PERMANENT 06/06/2008  . Pruritus   . Decubitus ulcer, buttock   . Decubitus ulcer, lower back   . Atrial fibrillation   . PVD (peripheral vascular disease)   . Barrett esophagus     Anti-infectives: 4/4 >> ceftriaxone >> 4/4 4/4 >> azith >>  4/4 4/4 >> Vanc >> 4/4 >> Zosyn >>  Assessment: [age] yoM admitted 4/4 from home with difficulty breathing and CXR shows RUL pneumonia.  Pt was hypotensive in ED, responded to saline infusion and is admitted with sepsis.  Pharmacy was initially consulted to dose ceftriaxone and azithromycin, but has been broadened to vancomycin and Zosyn.  First azithromycin and ceftriaxone doses in ED.  Tmax: 98.8  WBCs: 13.3  Renal: SCr 1.66, CrCl ~ 24 ml/min  Blood and urine cultures pending  Goal of Therapy:  Appropriate abx dosing, eradication of infection.  Plan:   Zosyn 3.375g IV Q8H infused over 4hrs. Vancomycin 1250mg  IV x1 dose, then 750mg  IV q24h. Measure Vanc trough at steady state. Follow up renal fxn and culture results.   Lynann Beaverhristine Boen Sterbenz PharmD, BCPS Pager [phone] 10/28/2013 5:22 PM

## 2013-10-28 NOTE — H&P (Signed)
[facility] History and Physical  [name] [name] FAO:[id number] DOB: Sep 17, 1917 DOA: 10/28/2013  Referring physician: Dr. Fonnie Jarvis PCP: Rogelia Boga, MD   Chief Complaint: Difficulty breathing  HPI: [name] [name] is a 80 y.o. male  With history of atrial fibrillation with pacemaker in place, history of hip fracture status post wheelchair-bound, benign prostatic hypertrophy. He presented to the ED with the above complaint which started since yesterday evening. The patient was found and noticed to have. No fevers reported. The symptoms have been persistent since onset and getting worse as a result patient was brought in to the emergency department for further evaluation and recommendations. Much of the history is obtained from the spouse, EMR and ED physician as patient is unable to provide meaningful history and has trouble with memory.   While in the ED the patient had a portable chest x-ray which reported right upper lobe pneumonia, was also initially found to be hypotensive which improved with 2 L normal saline infusion, also found to have elevated lactic acid level, and elevated white blood cell count of 13.3. We were consulted for further evaluation recommendations in this patient presenting with sepsis.   Review of Systems:  Unable to accurately assess as patient has trouble with memory and raises my suspicion for dementia.  Past Medical History  Diagnosis Date  . ATHEROSCLEROSIS W /INT CLAUDICATION 06/06/2008  . AV BLOCK, COMPLETE 06/06/2008  . BENIGN PROSTATIC HYPERTROPHY, WITH OBSTRUCTION 09/02/2009  . DVT, HX OF 06/04/2009  . GERD 05/16/2007  . HYPERTENSION 05/16/2007  . OSTEOARTHRITIS, KNEES, BILATERAL 07/12/2008  . PACEMAKER, PERMANENT 06/06/2008  . Pruritus   . Decubitus ulcer, buttock   . Decubitus ulcer, lower back   . Atrial fibrillation   . PVD (peripheral vascular disease)   . Barrett esophagus    Past Surgical History  Procedure Laterality Date  .  Cataract extraction    . Skin graft      to heel  . Hip fracture surgery    . Pacemaker insertion    . Tonsillectomy     Social History:  reports that he quit smoking about 69 years ago. He has never used smokeless tobacco. He reports that he does not drink alcohol or use illicit drugs.  Allergies  Allergen Reactions  . Cortisone Acetate     REACTION: reacted to injection in back    No family history on file.   Prior to Admission medications   Medication Sig Start Date End Date Taking? Authorizing Provider  acetaminophen (TYLENOL) 325 MG tablet Take 650 mg by mouth every 6 (six) hours as needed (pain).   Yes Historical Provider, MD  aspirin 81 MG tablet Take 81 mg by mouth daily.     Yes Historical Provider, MD  Calcium Carbonate-Vitamin D (CALCIUM-VITAMIN D) 600-200 MG-UNIT CAPS Take 1 capsule by mouth 2 (two) times daily.    Yes Historical Provider, MD  esomeprazole (NEXIUM) 40 MG capsule Take 1 capsule (40 mg total) by mouth daily before breakfast. 01/16/13  Yes Gordy Savers, MD  furosemide (LASIX) 20 MG tablet Take 1 tablet (20 mg total) by mouth daily. 01/16/13 02/18/14 Yes Gordy Savers, MD  mometasone (NASONEX) 50 MCG/ACT nasal spray Place 2 sprays into the nose daily. As directed 08/16/13  Yes Gordy Savers, MD  Multiple Vitamin (MULTIVITAMIN) tablet Take 1 tablet by mouth daily.     Yes Historical Provider, MD  multivitamin-lutein (OCUVITE-LUTEIN) CAPS capsule Take 1 capsule by mouth daily.   Yes Historical  Provider, MD  oxybutynin (DITROPAN) 5 MG tablet Take 5 mg by mouth 3 (three) times daily.   Yes Historical Provider, MD  Polyethyl Glycol-Propyl Glycol (SYSTANE) 0.4-0.3 % SOLN Apply to eye 2 (two) times daily.   Yes Historical Provider, MD   Physical Exam: Filed Vitals:   10/28/13 1540  BP: 111/58  Pulse:   Temp:   Resp: 20    BP 111/58  Pulse 56  Temp(Src) 98.6 F (37 C) (Other (Comment))  Resp 20  SpO2 92%  General:  Appears calm and  comfortable, nontoxic-appearing Eyes: PERRL, normal lids, irises & conjunctiva ENT: Dry mucous membranes, no masses on visual examination Neck: Supple, masses or thyromegaly Cardiovascular: Normal S1 and S2, no rubs Telemetry: Ventricular paced complexes Respiratory: Rales at right lung base, no wheezes, breath sounds bilaterally with equal chest rise Abdomen: soft, nt, nd Skin: no rash or induration seen on limited exam Musculoskeletal: grossly normal tone BUE/BLE Psychiatric: grossly normal mood and affect, speech fluent and appropriate Neurologic: Patient forgetful, answers questions appropriately, no facial asymmetry           Labs on Admission:  Basic Metabolic Panel:  Recent Labs Lab 10/28/13 1252  NA 142  K 4.2  CL 101  CO2 28  GLUCOSE 121*  BUN 41*  CREATININE 1.66*  CALCIUM 9.6   Liver Function Tests:  Recent Labs Lab 10/28/13 1252  AST 14  ALT 9  ALKPHOS 81  BILITOT 0.9  PROT 6.4  ALBUMIN 2.6*   No results found for this basename: LIPASE, AMYLASE,  in the last 168 hours No results found for this basename: AMMONIA,  in the last 168 hours CBC:  Recent Labs Lab 10/28/13 1252  WBC 13.3*  NEUTROABS 11.7*  HGB 12.4*  HCT 38.9*  MCV 99.5  PLT 169   Cardiac Enzymes: No results found for this basename: CKTOTAL, CKMB, CKMBINDEX, TROPONINI,  in the last 168 hours  BNP (last 3 results) No results found for this basename: PROBNP,  in the last 8760 hours CBG: No results found for this basename: GLUCAP,  in the last 168 hours  Radiological Exams on Admission: Dg Chest Port 1 View  10/28/2013   CLINICAL DATA:  Shortness  EXAM: PORTABLE CHEST - 1 VIEW  COMPARISON:  None.  FINDINGS: Cardiac shadow is within normal limits. The patient significant rotated to the right. Patchy right upper lobe infiltrate is seen consistent with acute pneumonia. No other focal infiltrate is noted. A pacing device is seen.  IMPRESSION: Right upper lobe pneumonia.   Electronically  Signed   By: Alcide CleverMark  Lukens M.D.   On: 10/28/2013 13:47    EKG: Independently reviewed. Ventricular paced, complexes  Assessment/Plan Principal Problem:   Sepsis - Source most likely pulmonary although UA appearing dirty as well. - Most likely cause of elevated lactic acidosis - Will broaden antibiotic coverage with vancomycin and Zosyn pharmacy to dose - Admitted to step down unit and monitor closely. Should blood pressures not improved with IV fluid rehydration (which at this moment they have) will consult PCCM for line placement for administration of pressors.  Active Problems: Sepsis associated hypotension - While in the ED patient has received 2 L of normal saline. Currently is on normal saline at 125 mL per hour. - Will transition to D5 one half normal saline with 20 mEq of potassium chloride at a rate of 125 mL per hour - Monitor blood pressures and as mentioned above May consult PCCM if blood pressures decline  despite fluid rehydration.    GERD - Continue protonic so patient is in house    Atrial fibrillation - Patient has pacemaker in place. Currently not on any rate controlling agents. Patient is wheelchair-bound and is currently on aspirin for anticoagulation. We'll plan on continuing aspirin at this juncture.  DVT prophylaxis with heparin    Code Status: full Family Communication: discussed with patient and spouse at bedside Disposition Plan: Step down unit  Time spent: > 70 minutes of critical care time  Penny Pia [facility] Pager [phone]

## 2013-10-28 NOTE — ED Notes (Signed)
Results of IStat CG4 given to [employer] Britany

## 2013-10-28 NOTE — Progress Notes (Signed)
ANTIBIOTIC CONSULT NOTE - INITIAL  Pharmacy Consult for Ceftriaxone, Azithromycin Indication: CAP  Allergies  Allergen Reactions  . Cortisone Acetate     REACTION: reacted to injection in back    Patient Measurements:   Last documented weight 62.6kg (03/11/13)  Vital Signs: Temp: 98.8 F (37.1 C) (04/04 1210) Temp src: Oral (04/04 1210) BP: 90/55 mmHg (04/04 1219) Pulse Rate: 70 (04/04 1219)  Labs: No results found for this basename: WBC, HGB, PLT, LABCREA, CREATININE,  in the last 72 hours The CrCl is unknown because both a height and weight (above a minimum accepted value) are required for this calculation.   Microbiology: No results found for this or any previous visit (from the past 720 hour(s)).  Medical History: Past Medical History  Diagnosis Date  . ATHEROSCLEROSIS W /INT CLAUDICATION 06/06/2008  . AV BLOCK, COMPLETE 06/06/2008  . BENIGN PROSTATIC HYPERTROPHY, WITH OBSTRUCTION 09/02/2009  . DVT, HX OF 06/04/2009  . GERD 05/16/2007  . HYPERTENSION 05/16/2007  . OSTEOARTHRITIS, KNEES, BILATERAL 07/12/2008  . PACEMAKER, PERMANENT 06/06/2008  . Pruritus   . Decubitus ulcer, buttock   . Decubitus ulcer, lower back   . Atrial fibrillation   . PVD (peripheral vascular disease)   . Barrett esophagus     Medications:  Anti-infectives   Start     Dose/Rate Route Frequency Ordered Stop   10/28/13 1245  cefTRIAXone (ROCEPHIN) 1 g in dextrose 5 % 50 mL IVPB     1 g 100 mL/hr over 30 Minutes Intravenous  Once 10/28/13 1234     10/28/13 1245  azithromycin (ZITHROMAX) 500 mg in dextrose 5 % 250 mL IVPB     500 mg 250 mL/hr over 60 Minutes Intravenous  Once 10/28/13 1234       Assessment: 89 yoM admitted 4/4 from home with CAP.  Pharmacy is consulted to dose ceftriaxone and azithromycin.  Tmax: 98.8  WBCs: pending  Renal: pending  Goal of Therapy:  Appropriate abx dosing, eradication of infection.  Plan:   Ceftriaxone 1g IV q24h  Azithromycin 500mg  IV  q24h.   Lynann Beaverhristine Calloway Andrus PharmD, BCPS Pager [phone] 10/28/2013 12:41 PM

## 2013-10-28 NOTE — ED Notes (Signed)
Pt from home via [facility] c/o generalized weakness and shortness of breath last p.m. Per EMS pt is warm to touch. Has Chronic Foley. Pt is sleepy

## 2013-10-28 NOTE — ED Notes (Signed)
MD Bednar at bedside 

## 2013-10-28 NOTE — ED Notes (Signed)
Per EMS Ronchi present bilaetrally. This RN hears clear but diminished.

## 2013-10-28 NOTE — ED Notes (Signed)
Dr. Vega at bedside. 

## 2013-10-28 NOTE — Progress Notes (Signed)
RT [facility] arrived. MD suspects sepsis with possible PNA (PNA confirmed by CXR). Dr. Fonnie JarvisBednar informed PT at that time that treatment plan would not include breathing txs, as he feels it is not beneficial. However, RT will place a PRN order in the event it should be needed.

## 2013-10-28 NOTE — ED Notes (Signed)
Respiratory at bedside.

## 2013-10-28 NOTE — ED Provider Notes (Signed)
CSN: [id number]     Arrival date & time 10/28/13  1203 History   First MD Initiated Contact with Patient 10/28/13 1220     Chief Complaint  Patient presents with  . Weakness  . Shortness of Breath     (Consider location/radiation/quality/duration/timing/severity/associated sxs/prior Treatment) HPI 80yo male with history of poor short term memory, DNR/DNI/no ICU/pressors/central line, and has a pacemaker for atrial fibrillation not on anticoagulation; at baseline is only strong enough to stand for transfer from bed to commode; his chronic indwelling Foley catheter for urinary retention; was at baseline state of health until overnight developed shaking chills sweats no cough or shortness with hypoxia on room air 84% brought to the emergency department weaker than baseline but no change in his baseline poor short term memory patient does not recall events of last night or this morning which is typical for him and that is baseline mental status according to his family wife and grandson. Past Medical History  Diagnosis Date  . ATHEROSCLEROSIS W /INT CLAUDICATION 06/06/2008  . AV BLOCK, COMPLETE 06/06/2008  . BENIGN PROSTATIC HYPERTROPHY, WITH OBSTRUCTION 09/02/2009  . DVT, HX OF 06/04/2009  . GERD 05/16/2007  . HYPERTENSION 05/16/2007  . OSTEOARTHRITIS, KNEES, BILATERAL 07/12/2008  . PACEMAKER, PERMANENT 06/06/2008  . Pruritus   . Decubitus ulcer, buttock   . Decubitus ulcer, lower back   . Atrial fibrillation   . PVD (peripheral vascular disease)   . Barrett esophagus    poor short term memory Past Surgical History  Procedure Laterality Date  . Cataract extraction    . Skin graft      to heel  . Hip fracture surgery    . Pacemaker insertion    . Tonsillectomy     No family history on file. History  Substance Use Topics  . Smoking status: Former Smoker    Quit date: 07/27/1944  . Smokeless tobacco: Never Used  . Alcohol Use: No    Review of Systems 10 Systems reviewed and are  negative for acute change except as noted in the HPI.   Allergies  Cortisone acetate  Home Medications   No current outpatient prescriptions on file. BP 113/51  Pulse 72  Temp(Src) 98 F (36.7 C) (Axillary)  Resp 17  Ht 6' (1.829 m)  Wt 130 lb 15.3 oz (59.4 kg)  BMI 17.76 kg/m2  SpO2 100% Physical Exam  Nursing note and vitals reviewed. Constitutional:  Awake, alert, nontoxic appearance.  HENT:  Head: Atraumatic.  Dry oral mucosa  Eyes: Right eye exhibits no discharge. Left eye exhibits no discharge.  Neck: Neck supple.  Cardiovascular: Normal rate and regular rhythm.   No murmur heard. Pulmonary/Chest: He is in respiratory distress. He has no wheezes. He has rales. He exhibits no tenderness.  Hypoxia room air pulse oximetry 84% on 100% mask patient his pulse oximetry in the 90s; crackles right base tachypnea no wheezing no retractions no accessory muscle usage  Abdominal: Soft. Bowel sounds are normal. He exhibits no distension. There is no tenderness. There is no rebound and no guarding.  Genitourinary:  Chronic indwelling Foley catheter  Musculoskeletal: He exhibits no edema and no tenderness.  Baseline ROM, no obvious new focal weakness.  Neurological: He is alert.  Mental status baseline and motor strength appears weaker than baseline with generalized weakness too weak to sit or stand today.  Skin: No rash noted.  Psychiatric: He has a normal mood and affect.    ED Course  Procedures (including critical care  time) Patient / Family / Caregiver understand and agree with initial ED impression and plan with expectations set for ED visit.Patient / Family / Caregiver informed of clinical course, understand medical decision-making process, and agree with plan.d/w [facility] for admit. Pt DNR/DNI/no ICU.  CRITICAL CARE Performed by: Hurman Horn Total critical care time: hypotension improving with initial ED IVF; hypoxia improved to O2 sat 90's with 100% O2  mask. Critical care time was exclusive of separately billable procedures and treating other patients. Critical care was necessary to treat or prevent imminent or life-threatening deterioration. Critical care was time spent personally by me on the following activities: development of treatment plan with patient and/or surrogate as well as nursing, discussions with consultants, evaluation of patient's response to treatment, examination of patient, obtaining history from patient or surrogate, ordering and performing treatments and interventions, ordering and review of laboratory studies, ordering and review of radiographic studies, pulse oximetry and re-evaluation of patient's condition.   Labs Review Labs Reviewed  MRSA PCR SCREENING - Abnormal; Notable for the following:    MRSA by PCR POSITIVE (*)    All other components within normal limits  CBC WITH DIFFERENTIAL - Abnormal; Notable for the following:    WBC 13.3 (*)    RBC 3.91 (*)    Hemoglobin 12.4 (*)    HCT 38.9 (*)    RDW 16.6 (*)    Neutrophils Relative % 88 (*)    Neutro Abs 11.7 (*)    Lymphocytes Relative 5 (*)    All other components within normal limits  COMPREHENSIVE METABOLIC PANEL - Abnormal; Notable for the following:    Glucose, Bld 121 (*)    BUN 41 (*)    Creatinine, Ser 1.66 (*)    Albumin 2.6 (*)    GFR calc non Af Amer 34 (*)    GFR calc Af Amer 39 (*)    All other components within normal limits  URINALYSIS, ROUTINE W REFLEX MICROSCOPIC - Abnormal; Notable for the following:    Color, Urine AMBER (*)    APPearance TURBID (*)    Hgb urine dipstick LARGE (*)    Bilirubin Urine SMALL (*)    Protein, ur 30 (*)    Leukocytes, UA LARGE (*)    All other components within normal limits  URINE MICROSCOPIC-ADD ON - Abnormal; Notable for the following:    Bacteria, UA MANY (*)    Casts HYALINE CASTS (*)    All other components within normal limits  I-STAT CG4 LACTIC ACID, ED - Abnormal; Notable for the following:     Lactic Acid, Venous 3.75 (*)    All other components within normal limits  CULTURE, BLOOD (ROUTINE X 2)  CULTURE, BLOOD (ROUTINE X 2)  URINE CULTURE  CULTURE, EXPECTORATED SPUTUM-ASSESSMENT  MAGNESIUM  PHOSPHORUS  COMPREHENSIVE METABOLIC PANEL  CBC  LEGIONELLA ANTIGEN, URINE  STREP PNEUMONIAE URINARY ANTIGEN   Imaging Review Dg Chest Port 1 View  10/28/2013   CLINICAL DATA:  Shortness  EXAM: PORTABLE CHEST - 1 VIEW  COMPARISON:  None.  FINDINGS: Cardiac shadow is within normal limits. The patient significant rotated to the right. Patchy right upper lobe infiltrate is seen consistent with acute pneumonia. No other focal infiltrate is noted. A pacing device is seen.  IMPRESSION: Right upper lobe pneumonia.   Electronically Signed   By: Alcide Clever M.D.   On: 10/28/2013 13:47     EKG Interpretation   Date/Time:  Saturday October 28 2013 12:16:03 EDT Ventricular  Rate:  73 PR Interval:  68 QRS Duration: 153 QT Interval:  450 QTC Calculation: 496 R Axis:   -112 Text Interpretation:  Ventricular-paced complexes No further analysis  attempted due to paced rhythm No previous ECGs available Confirmed by  [facility]  MD, Jonny Ruiz ([id number]) on 10/28/2013 12:38:53 PM      MDM   Final diagnoses:  Sepsis  CAP (community acquired pneumonia)  Renal insufficiency  Dehydration    The patient appears reasonably stabilized for admission considering the current resources, flow, and capabilities available in the ED at this time, and I doubt any other [facility] requiring further screening and/or treatment in the ED prior to admission.    Hurman Horn, MD 10/28/13 [phone]

## 2013-10-28 NOTE — ED Notes (Signed)
MD aware of hypotension

## 2013-10-29 DIAGNOSIS — E860 Dehydration: Secondary | ICD-10-CM

## 2013-10-29 DIAGNOSIS — I4891 Unspecified atrial fibrillation: Secondary | ICD-10-CM

## 2013-10-29 DIAGNOSIS — J189 Pneumonia, unspecified organism: Secondary | ICD-10-CM

## 2013-10-29 LAB — CBC
HEMATOCRIT: 36.4 % — AB (ref 39.0–52.0)
HEMOGLOBIN: 12.1 g/dL — AB (ref 13.0–17.0)
MCH: 32.8 pg (ref 26.0–34.0)
MCHC: 33.2 g/dL (ref 30.0–36.0)
MCV: 98.6 fL (ref 78.0–100.0)
Platelets: 159 10*3/uL (ref 150–400)
RBC: 3.69 MIL/uL — ABNORMAL LOW (ref 4.22–5.81)
RDW: 16.1 % — ABNORMAL HIGH (ref 11.5–15.5)
WBC: 10 10*3/uL (ref 4.0–10.5)

## 2013-10-29 LAB — COMPREHENSIVE METABOLIC PANEL
ALT: 10 U/L (ref 0–53)
AST: 14 U/L (ref 0–37)
Albumin: 2.2 g/dL — ABNORMAL LOW (ref 3.5–5.2)
Alkaline Phosphatase: 74 U/L (ref 39–117)
BUN: 50 mg/dL — AB (ref 6–23)
CALCIUM: 8.7 mg/dL (ref 8.4–10.5)
CO2: 28 mEq/L (ref 19–32)
Chloride: 105 mEq/L (ref 96–112)
Creatinine, Ser: 1.27 mg/dL (ref 0.50–1.35)
GFR calc non Af Amer: 47 mL/min — ABNORMAL LOW (ref >90)
GFR, EST AFRICAN AMERICAN: 54 mL/min — AB (ref >90)
GLUCOSE: 126 mg/dL — AB (ref 70–99)
Potassium: 4.1 mEq/L (ref 3.7–5.3)
Sodium: 142 mEq/L (ref 137–147)
TOTAL PROTEIN: 5.5 g/dL — AB (ref 6.0–8.3)
Total Bilirubin: 0.6 mg/dL (ref 0.3–1.2)

## 2013-10-29 LAB — BASIC METABOLIC PANEL
BUN: 50 mg/dL — ABNORMAL HIGH (ref 6–23)
CO2: 25 meq/L (ref 19–32)
CREATININE: 1.32 mg/dL (ref 0.50–1.35)
Calcium: 8.9 mg/dL (ref 8.4–10.5)
Chloride: 104 mEq/L (ref 96–112)
GFR calc Af Amer: 52 mL/min — ABNORMAL LOW (ref >90)
GFR calc non Af Amer: 45 mL/min — ABNORMAL LOW (ref >90)
Glucose, Bld: 124 mg/dL — ABNORMAL HIGH (ref 70–99)
POTASSIUM: 4.2 meq/L (ref 3.7–5.3)
Sodium: 140 mEq/L (ref 137–147)

## 2013-10-29 LAB — CLOSTRIDIUM DIFFICILE BY PCR: Toxigenic C. Difficile by PCR: NEGATIVE

## 2013-10-29 LAB — LEGIONELLA ANTIGEN, URINE: Legionella Antigen, Urine: NEGATIVE

## 2013-10-29 LAB — MAGNESIUM: MAGNESIUM: 2.5 mg/dL (ref 1.5–2.5)

## 2013-10-29 LAB — STREP PNEUMONIAE URINARY ANTIGEN: Strep Pneumo Urinary Antigen: NEGATIVE

## 2013-10-29 MED ORDER — SODIUM CHLORIDE 0.9 % IV BOLUS (SEPSIS)
250.0000 mL | INTRAVENOUS | Status: DC | PRN
  Administered 2013-10-29: 250 mL via INTRAVENOUS

## 2013-10-29 NOTE — Progress Notes (Signed)
[facility] PROGRESS NOTE  [name] [name] ZOX:[id number] DOB: 1918/03/14 DOA: 10/28/2013 PCP: Rogelia Boga, MD  Assessment/Plan: 80 y/o male with PMH of A FIB no on AC due to fall, bleeding risk, s/p PPM, dementia, history of hip fracture status post wheelchair-bound, benign prostatic hypertrophy presented with SOB, is admitted with sepsis, pneumonia   1. Sepsis/sirs/pneumonia/UTI; CXR: Right upper lobe pneumonia -cont IV atx, bronchodilators, oxygen; pend cultures   2. Acute hypoxic respiratory failure due pneumonia, cont as above    3. AKI likely due to sepsis/dehydration; cont IVF; monitor lytes   4. A FIB no on AC due to fall, bleeding risk; s/p PPM; HR stable not on AV node blocking agent; cont ASA  5. Dementia, likely worse for several month per his wife;  -cont supportive care   6. Probable underlying CHF; check echo;   D/w patient, confirmed with his wife  -patient is DNR  Code Status: DNR Family Communication: d/w patient, d/w patient  Wife Allensworth,Geraldine Spouse [phone] (indicate person spoken with, relationship, and if by phone, the number) Disposition Plan: pend clinical imprvgement    Consultants:  None   Procedures:  None   Antibiotics:  vanc 4/4<<<<  Zosyn 4/4<<<<   (indicate start date, and stop date if known)  HPI/Subjective: confused  Objective: Filed Vitals:   10/29/13 0900  BP: 84/45  Pulse: 54  Temp: 93.7 F (34.3 C)  Resp: 16    Intake/Output Summary (Last 24 hours) at 10/29/13 0927 Last data filed at 10/29/13 0900  Gross per 24 hour  Intake   2275 ml  Output    587 ml  Net   1688 ml   Filed Weights   10/28/13 1700 10/29/13 0400  Weight: 59.4 kg (130 lb 15.3 oz) 60.6 kg (133 lb 9.6 oz)    Exam:   General:  confused  Cardiovascular: s1,s2 rrr  Respiratory: BL rales   Abdomen: soft  Musculoskeletal: LE edema   Data Reviewed: Basic Metabolic Panel:  Recent Labs Lab 10/28/13 1252  10/28/13 1722 10/28/13 2331 10/29/13 0317  NA 142  --  140 142  K 4.2  --  4.2 4.1  CL 101  --  104 105  CO2 28  --  25 28  GLUCOSE 121*  --  124* 126*  BUN 41*  --  50* 50*  CREATININE 1.66*  --  1.32 1.27  CALCIUM 9.6  --  8.9 8.7  MG  --  2.1 2.5  --   PHOS  --  4.0  --   --    Liver Function Tests:  Recent Labs Lab 10/28/13 1252 10/29/13 0317  AST 14 14  ALT 9 10  ALKPHOS 81 74  BILITOT 0.9 0.6  PROT 6.4 5.5*  ALBUMIN 2.6* 2.2*   No results found for this basename: LIPASE, AMYLASE,  in the last 168 hours No results found for this basename: AMMONIA,  in the last 168 hours CBC:  Recent Labs Lab 10/28/13 1252 10/29/13 0317  WBC 13.3* 10.0  NEUTROABS 11.7*  --   HGB 12.4* 12.1*  HCT 38.9* 36.4*  MCV 99.5 98.6  PLT 169 159   Cardiac Enzymes: No results found for this basename: CKTOTAL, CKMB, CKMBINDEX, TROPONINI,  in the last 168 hours BNP (last 3 results) No results found for this basename: PROBNP,  in the last 8760 hours CBG: No results found for this basename: GLUCAP,  in the last 168 hours  Recent Results (from the past 240 hour(s))  MRSA PCR SCREENING     Status: Abnormal   Collection Time    10/28/13  5:10 PM      Result Value Ref Range Status   MRSA by PCR POSITIVE (*) NEGATIVE Final   Comment:            The GeneXpert MRSA Assay (FDA     approved for NASAL specimens     only), is one component of a     comprehensive MRSA colonization     surveillance program. It is not     intended to diagnose MRSA     infection nor to guide or     monitor treatment for     MRSA infections.     RESULT CALLED TO, READ BACK BY AND VERIFIED WITH:     C.SMITH AT 1822 ON [id number] BY C.BONGEL  CULTURE, EXPECTORATED SPUTUM-ASSESSMENT     Status: None   Collection Time    10/28/13  9:55 PM      Result Value Ref Range Status   Specimen Description SPUTUM   Final   Special Requests Immunocompromised   Final   Sputum evaluation     Final   Value: THIS SPECIMEN IS  ACCEPTABLE. RESPIRATORY CULTURE REPORT TO FOLLOW.   Report Status 10/28/2013 FINAL   Final     Studies: Dg Chest Port 1 View  10/28/2013   CLINICAL DATA:  Shortness  EXAM: PORTABLE CHEST - 1 VIEW  COMPARISON:  None.  FINDINGS: Cardiac shadow is within normal limits. The patient significant rotated to the right. Patchy right upper lobe infiltrate is seen consistent with acute pneumonia. No other focal infiltrate is noted. A pacing device is seen.  IMPRESSION: Right upper lobe pneumonia.   Electronically Signed   By: Alcide CleverMark  Lukens M.D.   On: 10/28/2013 13:47    Scheduled Meds: . aspirin  81 mg Oral Daily  . Chlorhexidine Gluconate Cloth  6 each Topical Q0600  . heparin  5,000 Units Subcutaneous 3 times per day  . multivitamin with minerals  1 tablet Oral Daily  . multivitamin-lutein  1 capsule Oral Daily  . mupirocin ointment  1 application Nasal BID  . oxybutynin  5 mg Oral TID  . pantoprazole  40 mg Oral Daily  . piperacillin-tazobactam (ZOSYN)  IV  3.375 g Intravenous Q8H  . pneumococcal 23 valent vaccine  0.5 mL Intramuscular Tomorrow-1000  . polyvinyl alcohol  1 drop Both Eyes BID  . sodium chloride  3 mL Intravenous Q12H  . vancomycin  750 mg Intravenous Q24H   Continuous Infusions: . dextrose 5 % and 0.45 % NaCl with KCl 20 mEq/L 125 mL/hr at 10/29/13 [id number]    Principal Problem:   Sepsis Active Problems:   GERD   PACEMAKER, PERMANENT   Atrial fibrillation   Sepsis associated hypotension    Time spent: >35 minutes     Esperanza SheetsBURIEV, Gerardo Caiazzo N  [facility] Pager [phone]. If 7PM-7AM, please contact night-coverage at [email or web address], password [email or web address] 10/29/2013, 9:27 AM  LOS: 1 day

## 2013-10-30 ENCOUNTER — Encounter (HOSPITAL_BASED_OUTPATIENT_CLINIC_OR_DEPARTMENT_OTHER): Payer: Medicare Other

## 2013-10-30 DIAGNOSIS — E43 Unspecified severe protein-calorie malnutrition: Secondary | ICD-10-CM | POA: Diagnosis present

## 2013-10-30 DIAGNOSIS — I369 Nonrheumatic tricuspid valve disorder, unspecified: Secondary | ICD-10-CM

## 2013-10-30 HISTORY — DX: Unspecified severe protein-calorie malnutrition: E43

## 2013-10-30 LAB — URINE CULTURE

## 2013-10-30 MED ORDER — ENSURE COMPLETE PO LIQD
237.0000 mL | Freq: Three times a day (TID) | ORAL | Status: DC
  Administered 2013-10-30 – 2013-11-01 (×5): 237 mL via ORAL

## 2013-10-30 NOTE — Progress Notes (Addendum)
[facility] PROGRESS NOTE  [name] [name] ZOX:[id number] DOB: 1917-10-05 DOA: 10/28/2013 PCP: Rogelia Boga, MD  Assessment/Plan: 80 y/o male with PMH of A FIB no on AC due to fall, bleeding risk, s/p PPM, dementia, history of hip fracture status post wheelchair-bound, benign prostatic hypertrophy presented with SOB, is admitted with sepsis, pneumonia, UTI  1. Sepsis/sirs/pneumonia/UTI; CXR: Right upper lobe pneumonia -improving; cont IV atx, bronchodilators, oxygen; blood cultures NGTD; urine cultures MRSA;  -echo : possibility of a vegetation on the aortic valve. Heavy calcification of the aortic valve. ? Sclerotic changes; d/w patient, his wife; they do not want TEE or further investigation; will ask ID evaluation for possible empiric treatment   2. Acute hypoxic respiratory failure due pneumonia, improved; cont as above    3. AKI likely due to sepsis/dehydration; improved; cont IVF; monitor lytes   4. A FIB no on AC due to fall, bleeding risk; s/p PPM; HR stable not on AV node blocking agent; cont ASA  5. Dementia, likely worse for several month per his wife;  -cont supportive care   6. Chronic CHF; echo: LVEF 55%, AS with Mean gradient: 16 mm; cardiomyopathy, mild RV dysfunction  -resume home diuretic when hemodynamically stable;    TF to [facility] 4/6  D/w patient, confirmed with his wife  -patient is DNR, may need palliative care   Code Status: DNR Family Communication: d/w patient, d/w patient  Wife Viele,Geraldine Spouse [phone] (indicate person spoken with, relationship, and if by phone, the number) Disposition Plan: pend clinical improvement, likely SNF    Consultants:  None   Procedures:  None   Antibiotics:  vanc 4/4<<<<  Zosyn 4/4<<<<   (indicate start date, and stop date if known)  HPI/Subjective: confused  Objective: Filed Vitals:   10/30/13 0700  BP: 135/62  Pulse: 64  Temp:   Resp: 19    Intake/Output Summary (Last 24 hours)  at 10/30/13 0752 Last data filed at 10/30/13 0700  Gross per 24 hour  Intake   2420 ml  Output    740 ml  Net   1680 ml   Filed Weights   10/28/13 1700 10/29/13 0400  Weight: 59.4 kg (130 lb 15.3 oz) 60.6 kg (133 lb 9.6 oz)    Exam:   General:  confused  Cardiovascular: s1,s2 rrr  Respiratory: BL rales   Abdomen: soft  Musculoskeletal: LE edema   Data Reviewed: Basic Metabolic Panel:  Recent Labs Lab 10/28/13 1252 10/28/13 1722 10/28/13 2331 10/29/13 0317  NA 142  --  140 142  K 4.2  --  4.2 4.1  CL 101  --  104 105  CO2 28  --  25 28  GLUCOSE 121*  --  124* 126*  BUN 41*  --  50* 50*  CREATININE 1.66*  --  1.32 1.27  CALCIUM 9.6  --  8.9 8.7  MG  --  2.1 2.5  --   PHOS  --  4.0  --   --    Liver Function Tests:  Recent Labs Lab 10/28/13 1252 10/29/13 0317  AST 14 14  ALT 9 10  ALKPHOS 81 74  BILITOT 0.9 0.6  PROT 6.4 5.5*  ALBUMIN 2.6* 2.2*   No results found for this basename: LIPASE, AMYLASE,  in the last 168 hours No results found for this basename: AMMONIA,  in the last 168 hours CBC:  Recent Labs Lab 10/28/13 1252 10/29/13 0317  WBC 13.3* 10.0  NEUTROABS 11.7*  --  HGB 12.4* 12.1*  HCT 38.9* 36.4*  MCV 99.5 98.6  PLT 169 159   Cardiac Enzymes: No results found for this basename: CKTOTAL, CKMB, CKMBINDEX, TROPONINI,  in the last 168 hours BNP (last 3 results) No results found for this basename: PROBNP,  in the last 8760 hours CBG: No results found for this basename: GLUCAP,  in the last 168 hours  Recent Results (from the past 240 hour(s))  CULTURE, BLOOD (ROUTINE X 2)     Status: None   Collection Time    10/28/13 12:52 PM      Result Value Ref Range Status   Specimen Description BLOOD RIGHT ANTECUBITAL   Final   Special Requests BOTTLES DRAWN AEROBIC AND ANAEROBIC 5 CC EACH   Final   Culture  Setup Time     Final   Value: 10/28/2013 18:49     Performed at [facility]   Culture     Final   Value:        BLOOD  CULTURE RECEIVED NO GROWTH TO DATE CULTURE WILL BE HELD FOR 5 DAYS BEFORE ISSUING A FINAL NEGATIVE REPORT     Performed at [facility]   Report Status PENDING   Incomplete  CULTURE, BLOOD (ROUTINE X 2)     Status: None   Collection Time    10/28/13  1:00 PM      Result Value Ref Range Status   Specimen Description BLOOD RIGHT ANTECUBITAL   Final   Special Requests BOTTLES DRAWN AEROBIC AND ANAEROBIC 2 CC   Final   Culture  Setup Time     Final   Value: 10/28/2013 18:48     Performed at [facility]   Culture     Final   Value:        BLOOD CULTURE RECEIVED NO GROWTH TO DATE CULTURE WILL BE HELD FOR 5 DAYS BEFORE ISSUING A FINAL NEGATIVE REPORT     Performed at [facility]   Report Status PENDING   Incomplete  URINE CULTURE     Status: None   Collection Time    10/28/13  1:53 PM      Result Value Ref Range Status   Specimen Description URINE, CATHETERIZED   Final   Special Requests NONE   Final   Culture  Setup Time     Final   Value: 10/28/2013 21:42     Performed at [facility] Count     Final   Value: >=100,000 COLONIES/ML     Performed at [facility]   Culture     Final   Value: STAPHYLOCOCCUS AUREUS     Note: RIFAMPIN AND GENTAMICIN SHOULD NOT BE USED AS SINGLE DRUGS FOR TREATMENT OF STAPH INFECTIONS.     Performed at [facility]   Report Status PENDING   Incomplete  MRSA PCR SCREENING     Status: Abnormal   Collection Time    10/28/13  5:10 PM      Result Value Ref Range Status   MRSA by PCR POSITIVE (*) NEGATIVE Final   Comment:            The GeneXpert MRSA Assay (FDA     approved for NASAL specimens     only), is one component of a     comprehensive MRSA colonization     surveillance program. It is not     intended to diagnose MRSA     infection  nor to guide or     monitor treatment for     MRSA infections.     RESULT CALLED TO, READ BACK BY AND VERIFIED WITH:     C.SMITH AT 1822 ON [id number] BY  C.BONGEL  CULTURE, EXPECTORATED SPUTUM-ASSESSMENT     Status: None   Collection Time    10/28/13  9:55 PM      Result Value Ref Range Status   Specimen Description SPUTUM   Final   Special Requests Immunocompromised   Final   Sputum evaluation     Final   Value: THIS SPECIMEN IS ACCEPTABLE. RESPIRATORY CULTURE REPORT TO FOLLOW.   Report Status 10/28/2013 FINAL   Final  CULTURE, RESPIRATORY (NON-EXPECTORATED)     Status: None   Collection Time    10/28/13  9:55 PM      Result Value Ref Range Status   Specimen Description SPUTUM   Final   Special Requests NONE   Final   Gram Stain     Final   Value: ABUNDANT WBC PRESENT, PREDOMINANTLY PMN     FEW SQUAMOUS EPITHELIAL CELLS PRESENT     MODERATE YEAST     MODERATE GRAM POSITIVE RODS     FEW GRAM NEGATIVE RODS   Culture PENDING   Incomplete   Report Status PENDING   Incomplete  CLOSTRIDIUM DIFFICILE BY PCR     Status: None   Collection Time    10/29/13  7:41 AM      Result Value Ref Range Status   C difficile by pcr NEGATIVE  NEGATIVE Final   Comment[facility]     Studies: Dg Chest Port 1 View  10/28/2013   CLINICAL DATA:  Shortness  EXAM: PORTABLE CHEST - 1 VIEW  COMPARISON:  None.  FINDINGS: Cardiac shadow is within normal limits. The patient significant rotated to the right. Patchy right upper lobe infiltrate is seen consistent with acute pneumonia. No other focal infiltrate is noted. A pacing device is seen.  IMPRESSION: Right upper lobe pneumonia.   Electronically Signed   By: Alcide CleverMark  Lukens M.D.   On: 10/28/2013 13:47    Scheduled Meds: . aspirin  81 mg Oral Daily  . Chlorhexidine Gluconate Cloth  6 each Topical Q0600  . heparin  5,000 Units Subcutaneous 3 times per day  . multivitamin with minerals  1 tablet Oral Daily  . multivitamin-lutein  1 capsule Oral Daily  . mupirocin ointment  1 application Nasal BID  . pantoprazole  40 mg Oral Daily  . piperacillin-tazobactam (ZOSYN)  IV  3.375 g  Intravenous Q8H  . pneumococcal 23 valent vaccine  0.5 mL Intramuscular Tomorrow-1000  . polyvinyl alcohol  1 drop Both Eyes BID  . sodium chloride  3 mL Intravenous Q12H  . vancomycin  750 mg Intravenous Q24H   Continuous Infusions: . dextrose 5 % and 0.45 % NaCl with KCl 20 mEq/L 75 mL/hr at 10/30/13 0119    Principal Problem:   Sepsis Active Problems:   GERD   PACEMAKER, PERMANENT   Atrial fibrillation   Sepsis associated hypotension    Time spent: >35 minutes     Esperanza SheetsBURIEV, [name] [name]  [facility] Pager [phone]. If 7PM-7AM, please contact night-coverage at [email or web address], password [email or web address] 10/30/2013, 7:52 AM  LOS: 2 days

## 2013-10-30 NOTE — Progress Notes (Signed)
Lab called stating that pt. Urine culture was positive for MRSA. MD notified. Will continue to monitor.

## 2013-10-30 NOTE — Consult Note (Signed)
WOC wound consult note Reason for Consult: Multiple pressure ulcers to sacrum, right trochanter, right buttocks and mid back .  Intact bruising to bilateral heels.  Wound type: Pressure ulcers, present on admission.  Pressure Ulcer POA: Yes Measurement: mid back:  Stage II, 3 cm x 1 cm x 0.1 cm Right trochanter 1 cm x 1 cm x 0.1 cm Stage II Sacrum 6 cm x 4 cm x 0.3 cm with surrounding Deep tissue injury Right buttocks 2 cm x 2 cm x 0.1 cm Stage II Wound bed: Bruised and pink Drainage (amount, consistency, odor) Minimal serosanguinous drainage, no odor.  Periwound: Bruised and fragile.  Dressing procedure/placement/frequency: Cleanse all wounds with NS and pat gently dry.  Apply Allevyn silicone border dressing to all areas.  Change every 3 days and PRN soilage.   If diarrhea becomes severe, discontinue Allevyn sacral dressing and use Mepitel silicone dressing covered with 4x4 and ABD pads.  Wife has been performing wound care at home.  WOC team will continue to follow at this time.  Maple HudsonKaren Delmy Holdren RN BSN CWON Pager [phone]

## 2013-10-30 NOTE — Clinical Documentation Improvement (Signed)
Possible Clinical Conditions?  Chronic Systolic Congestive Heart Failure Chronic Diastolic Congestive Heart Failure Chronic Systolic & Diastolic Congestive Heart Failure Acute Systolic Congestive Heart Failure Acute Diastolic Congestive Heart Failure Acute Systolic & Diastolic Congestive Heart Failure Acute on Chronic Systolic Congestive Heart Failure Acute on Chronic Diastolic Congestive Heart Failure Acute on Chronic Systolic & Diastolic Congestive Heart Failure Other Condition________________________________________ Cannot Clinically Determine  Supporting Information:  Per 10/30/13 progress notes Probable underlying CHF; check echo.       Thank You, Shelda Palarlene H Cooper ,RN Clinical Documentation Specialist:  [phone]  [facility]- [facility] Information Management

## 2013-10-30 NOTE — Clinical Documentation Improvement (Signed)
Possible Clinical Conditions?  Functional quadriplegia Functional quadriparesis  ____Other Condition__________________ ____Cannot Clinically Determine   Supporting Information: Per 10/28/13 H&P Patient is wheelchair-bound and is currently on aspirin for anticoagulation.  Per 10/29/13 Progress note: 80 y/o male with PMH of A FIB no on AC due to fall, bleeding risk, s/p PPM, dementia, history of hip fracture status post wheelchair-bound.  Thank You, Shelda Palarlene H Cooper ,RN Clinical Documentation Specialist:  [phone]  [facility]

## 2013-10-30 NOTE — Progress Notes (Signed)
Echocardiogram 2D Echocardiogram has been performed.  [name] [name], [name] [name] M 10/30/2013, 11:39 AM

## 2013-10-30 NOTE — Progress Notes (Addendum)
INITIAL NUTRITION ASSESSMENT  Pt meets criteria for severe MALNUTRITION in the context of chronic illness as evidenced by severe fat loss in clavicles, upper arms, and legs with likely <75% estimated energy intake in the past month.  DOCUMENTATION CODES Per approved criteria  -Underweight -Severe malnutrition in the context of chronic illness   INTERVENTION: - Downgrade diet to dysphagia 3/thin per wife's report of pt needing chopped meats - Ensure Complete TID - Will continue to monitor   NUTRITION DIAGNOSIS: Increased nutrient needs related to underweight as evidenced by body mass index is 18.12 kg/(m^2).   Goal: Pt to consume >90% of meals and supplements  Monitor:  Weights, labs, intake  Reason for Assessment: Malnutrition screening tool   80 y.o. male  Admitting Dx: Sepsis  ASSESSMENT: Pt with hx of dementia, history of hip fracture status post wheelchair-bound, benign prostatic hypertrophy presented with SOB, is admitted with sepsis, pneumonia, UTI. Pt stated he eats "fair" at home.   Obtained diet history per telephone conversation with wife. She reports pt eats small amounts of food at home - cereal/egg/juice/1/2 banana for breakfast, peanut butter sandwich/juice/ice cream for lunch, and a meat/potato/vegetable/dessert for dinner. States pt does not have any teeth so foods must be cut up very fine. Discussed diet textures and wife requested chopped meats. She denies that pt has any problems swallowing. Pt drinks 1/2 to 1 Ensure/day at home. Weight down 5 pounds from usual weight of 138 pounds.   Nutrition Focused Physical Exam:  Subcutaneous Fat:  Orbital Region: mild/moderate fat loss Upper Arm Region: severe fat loss Thoracic and Lumbar Region: mild/moderate fat loss  Muscle:  Temple Region: mild/moderate fat loss Clavicle Bone Region: severe muscle wasting and fat loss Clavicle and Acromion Bone Region: severe muscle wasting and fat loss Scapular Bone Region:  NA Dorsal Hand: mild/moderate fat loss Patellar Region: severe fat loss Anterior Thigh Region: severe fat loss Posterior Calf Region: severe fat loss  Edema: Non-pitting RLE, LLE edema    Height: Ht Readings from Last 1 Encounters:  10/28/13 6' (1.829 m)    Weight: Wt Readings from Last 1 Encounters:  10/29/13 133 lb 9.6 oz (60.6 kg)    Ideal Body Weight: 178 lbs  % Ideal Body Weight: 75%  Wt Readings from Last 10 Encounters:  10/29/13 133 lb 9.6 oz (60.6 kg)  03/11/13 138 lb (62.596 kg)  01/16/13 138 lb (62.596 kg)  11/10/11 135 lb (61.236 kg)  09/10/11 137 lb (62.143 kg)  10/30/10 136 lb (61.689 kg)  11/19/09 149 lb (67.586 kg)  08/05/09 149 lb (67.586 kg)  07/17/09 150 lb (68.04 kg)  07/02/09 144 lb (65.318 kg)    Usual Body Weight: 138 lbs per wife  % Usual Body Weight: 96%  BMI:  Body mass index is 18.12 kg/(m^2). Underweight  Estimated Nutritional Needs: Kcal: 2150-2350 Protein: 110-130g Fluid: 2.1-2.3L/day  Skin: Non-pitting RLE, LLE edema, stage 1 right hip pressure ulcer  Diet Order: Cardiac  EDUCATION NEEDS: -No education needs identified at this time   Intake/Output Summary (Last 24 hours) at 10/30/13 1034 Last data filed at 10/30/13 0800  Gross per 24 hour  Intake   1820 ml  Output    610 ml  Net   1210 ml    Last BM: 4/6  Labs:   Recent Labs Lab 10/28/13 1252 10/28/13 1722 10/28/13 2331 10/29/13 0317  NA 142  --  140 142  K 4.2  --  4.2 4.1  CL 101  --  104 105  CO2 28  --  25 28  BUN 41*  --  50* 50*  CREATININE 1.66*  --  1.32 1.27  CALCIUM 9.6  --  8.9 8.7  MG  --  2.1 2.5  --   PHOS  --  4.0  --   --   GLUCOSE 121*  --  124* 126*    CBG (last 3)  No results found for this basename: GLUCAP,  in the last 72 hours  Scheduled Meds: . aspirin  81 mg Oral Daily  . Chlorhexidine Gluconate Cloth  6 each Topical Q0600  . heparin  5,000 Units Subcutaneous 3 times per day  . multivitamin with minerals  1 tablet Oral  Daily  . multivitamin-lutein  1 capsule Oral Daily  . mupirocin ointment  1 application Nasal BID  . pantoprazole  40 mg Oral Daily  . piperacillin-tazobactam (ZOSYN)  IV  3.375 g Intravenous Q8H  . pneumococcal 23 valent vaccine  0.5 mL Intramuscular Tomorrow-1000  . polyvinyl alcohol  1 drop Both Eyes BID  . sodium chloride  3 mL Intravenous Q12H  . vancomycin  750 mg Intravenous Q24H    Continuous Infusions: . dextrose 5 % and 0.45 % NaCl with KCl 20 mEq/L 75 mL/hr at 10/30/13 0119    Past Medical History  Diagnosis Date  . ATHEROSCLEROSIS W /INT CLAUDICATION 06/06/2008  . AV BLOCK, COMPLETE 06/06/2008  . BENIGN PROSTATIC HYPERTROPHY, WITH OBSTRUCTION 09/02/2009  . DVT, HX OF 06/04/2009  . GERD 05/16/2007  . HYPERTENSION 05/16/2007  . OSTEOARTHRITIS, KNEES, BILATERAL 07/12/2008  . PACEMAKER, PERMANENT 06/06/2008  . Pruritus   . Decubitus ulcer, buttock   . Decubitus ulcer, lower back   . Atrial fibrillation   . PVD (peripheral vascular disease)   . Barrett esophagus     Past Surgical History  Procedure Laterality Date  . Cataract extraction    . Skin graft      to heel  . Hip fracture surgery    . Pacemaker insertion    . Tonsillectomy      Levon Hedger MS, RD, LDN [phone] Pager [phone] After Hours Pager

## 2013-10-31 DIAGNOSIS — N289 Disorder of kidney and ureter, unspecified: Secondary | ICD-10-CM

## 2013-10-31 LAB — CULTURE, RESPIRATORY W GRAM STAIN

## 2013-10-31 LAB — CBC WITH DIFFERENTIAL/PLATELET
BASOS PCT: 0 % (ref 0–1)
Basophils Absolute: 0 10*3/uL (ref 0.0–0.1)
Eosinophils Absolute: 0.1 10*3/uL (ref 0.0–0.7)
Eosinophils Relative: 1 % (ref 0–5)
HCT: 34.9 % — ABNORMAL LOW (ref 39.0–52.0)
Hemoglobin: 11.7 g/dL — ABNORMAL LOW (ref 13.0–17.0)
Lymphocytes Relative: 9 % — ABNORMAL LOW (ref 12–46)
Lymphs Abs: 0.7 10*3/uL (ref 0.7–4.0)
MCH: 32.4 pg (ref 26.0–34.0)
MCHC: 33.5 g/dL (ref 30.0–36.0)
MCV: 96.7 fL (ref 78.0–100.0)
Monocytes Absolute: 0.3 10*3/uL (ref 0.1–1.0)
Monocytes Relative: 4 % (ref 3–12)
NEUTROS PCT: 86 % — AB (ref 43–77)
Neutro Abs: 6.4 10*3/uL (ref 1.7–7.7)
PLATELETS: 148 10*3/uL — AB (ref 150–400)
RBC: 3.61 MIL/uL — ABNORMAL LOW (ref 4.22–5.81)
RDW: 15.8 % — ABNORMAL HIGH (ref 11.5–15.5)
WBC: 7.4 10*3/uL (ref 4.0–10.5)

## 2013-10-31 LAB — BASIC METABOLIC PANEL
BUN: 27 mg/dL — AB (ref 6–23)
CALCIUM: 8.6 mg/dL (ref 8.4–10.5)
CO2: 25 mEq/L (ref 19–32)
Chloride: 102 mEq/L (ref 96–112)
Creatinine, Ser: 0.86 mg/dL (ref 0.50–1.35)
GFR calc non Af Amer: 72 mL/min — ABNORMAL LOW (ref >90)
GFR, EST AFRICAN AMERICAN: 84 mL/min — AB (ref >90)
GLUCOSE: 211 mg/dL — AB (ref 70–99)
POTASSIUM: 4 meq/L (ref 3.7–5.3)
SODIUM: 142 meq/L (ref 137–147)

## 2013-10-31 LAB — CULTURE, RESPIRATORY

## 2013-10-31 LAB — VANCOMYCIN, RANDOM: Vancomycin Rm: 25.5 ug/mL

## 2013-10-31 MED ORDER — HALOPERIDOL 2 MG PO TABS
2.0000 mg | ORAL_TABLET | Freq: Three times a day (TID) | ORAL | Status: DC | PRN
  Administered 2013-11-01: 2 mg via ORAL
  Filled 2013-10-31 (×2): qty 1

## 2013-10-31 MED ORDER — HALOPERIDOL LACTATE 5 MG/ML IJ SOLN
2.0000 mg | Freq: Four times a day (QID) | INTRAMUSCULAR | Status: DC | PRN
  Administered 2013-10-31 (×2): 2 mg via INTRAMUSCULAR
  Filled 2013-10-31 (×2): qty 1

## 2013-10-31 NOTE — Evaluation (Signed)
Occupational Therapy Evaluation Patient Details Name: [name] [name] MRN: [id number] DOB: 10-02-1917 Today's Date: 10/31/2013    History of Present Illness Pt 80 year old male with PMH of afib, s/p PPM, dementia, history of hip fracture status post wheelchair-bound, benign prostatic hypertrophy and admitted with sepsis, pneumonia, UTI.   Clinical Impression   Pt was admitted for sepsis, pna, and uti.  At baseline, he performed UB adls and had assist with LB adls and A x 2 for transfers.  Pt now needs max A for grooming and total A for UB and total A x 2 for LB adls.  Will follow in OT in acute care to increase mobility related to ADLs and attention to task with ADLs to decrease burden of care.  Pt will need snf at d/c.    Follow Up Recommendations  SNF    Equipment Recommendations  None recommended by OT    Recommendations for Other Services       Precautions / Restrictions Precautions Precautions: Fall Precaution Comments: multiple pressure ulcers Restrictions Weight Bearing Restrictions: No      Mobility Bed Mobility Overal bed mobility: +2 for physical assistance Bed Mobility: Rolling Rolling: Total assist x 2 for complete roll       General bed mobility comments: used pad to assist pt rolling to L; need A x 2 to roll completely  Transfers                     Balance                                          ADL Overall ADL's : Needs assistance/impaired Eating/Feeding:  (did not want to eat; wife was going to feed him)   Grooming: Wash/dry face;Minimal assistance;Bed level Grooming Details (indicate cue type and reason): multimodal cues for thoroughness                               General ADL Comments: total A for all adls (A x 2 for LB) except for grooming today.       Vision                     Perception     Praxis      Pertinent Vitals/Pain No c/o pain     Hand Dominance     Extremity/Trunk  Assessment Upper Extremity Assessment Upper Extremity Assessment: Generalized weakness:  Performed 5 reps each shoulder AAROM   Lower Extremity Assessment Lower Extremity Assessment: Generalized weakness       Communication Communication Communication: HOH (mild)   Cognition Arousal/Alertness: Lethargic Behavior During Therapy: WFL for tasks assessed/performed Overall Cognitive Status: History of cognitive impairments - at baseline (pt oriented to self only)                     General Comments       Exercises       Shoulder Instructions      Home Living Family/patient expects to be discharged to:: Skilled nursing facility Living Arrangements: Spouse/significant other;Children                           Home Equipment: Wheelchair - manual   Additional Comments: pt poor historian  Prior Functioning/Environment Level of Independence: Independent with assistive device(s)        Comments: wife present for session. Daughter has been helping her get pt to w/c.  Daughter has had neck sx and is doing too much.  Pt would perform UB adls and wife did LB.  A x 2 to transfer to w/c    OT Diagnosis: Generalized weakness;Cognitive deficits   OT Problem List:     OT Treatment/Interventions: Self-care/ADL training;Balance training;Cognitive remediation/compensation;Patient/family education    OT Goals(Current goals can be found in the care plan section) Acute Rehab OT Goals Patient Stated Goal: rehab to get stronger OT Goal Formulation: With family Time For Goal Achievement: 11/14/13 Potential to Achieve Goals: Fair ADL Goals Additional ADL Goal #1: pt will maintain static sitting balance at eob with min A x 2 for safety for 3 minutes in preparation for adls. Additional ADL Goal #2: pt will roll to bil sides with max A x 2 for adls Additional ADL Goal #3: pt will participate in UE/grooming task for 5 minutes multimodal cues and rest breaks  OT Frequency:  Min 2X/week   Barriers to D/C:            Co-evaluation              End of Session    Activity Tolerance: Patient tolerated treatment well Patient left: in bed;with family/visitor present   Time:  -    Charges:  OT General Charges $OT Visit: 1 Procedure OT Evaluation $Initial OT Evaluation Tier I: 1 Procedure OT Treatments $Therapeutic Activity: 8-22 mins G-Codes:    Saphire Barnhart 10/31/2013, 2:16 PM  Marica OtterMaryellen Levert Heslop, OTR/L [phone] 10/31/2013

## 2013-10-31 NOTE — Progress Notes (Signed)
[facility] PROGRESS NOTE  [name] [name] AVW:[id number] DOB: [birth year] DOA: 10/28/2013 PCP: Rogelia Boga, MD  Assessment/Plan: 80 y/o male with PMH of A FIB no on AC due to fall, bleeding risk, s/p PPM, dementia, history of hip fracture status post wheelchair-bound, benign prostatic hypertrophy presented with SOB, is admitted with sepsis, pneumonia, UTI  1. Sepsis/sirs/pneumonia/UTI; CXR: Right upper lobe pneumonia hemodynamically improving; cont IV atx, bronchodilators, oxygen; blood cultures NGTD; urine cultures MRSA; sputum, Gr"+""-", yeast -echo: possibility of a vegetation on the aortic valve. Heavy calcification of the aortic valve. ? Sclerotic changes; d/w patient, his wife; they do not want TEE or further aggressive investigation; D/w Dr. Drue Second who kindly agreed to evaluate for possible empiric treatment   2. Acute hypoxic respiratory failure due pneumonia, improved; cont as above    3. AKI likely due to sepsis/dehydration; improved on IVF; monitor lytes   4. A FIB not on AC due to fall, bleeding risk; s/p PPM; HR stable not on AV node blocking agent; cont ASA  5. Delirium on top of dementia worse with infection; prn haldol; cont supportive care   6. Chronic CHF; echo: LVEF 55%, AS with Mean gradient: 16 mm; cardiomyopathy, mild RV dysfunction  -resume home diuretic when hemodynamically stable;    TF to ICU->RNF 4/6  D/w patient, confirmed with his wife  -patient is DNR, prognosis is guarded; may need palliative care if not improved   Code Status: DNR Family Communication: d/w patient, d/w patient  Wife Mandile,[name] [name] [phone] (indicate person spoken with, relationship, and if by phone, the number) Disposition Plan: pend clinical improvement, likely SNF    Consultants:  None   Procedures:  None   Antibiotics:  vanc 4/4<<<<  Zosyn 4/4<<<<   (indicate start date, and stop date if  known)  HPI/Subjective: confused  Objective: Filed Vitals:   10/31/13 0507  BP: 168/73  Pulse: 74  Temp: 97.5 F (36.4 C)  Resp: 22    Intake/Output Summary (Last 24 hours) at 10/31/13 0826 Last data filed at 10/31/13 0510  Gross per 24 hour  Intake   1708 ml  Output    415 ml  Net   1293 ml   Filed Weights   10/28/13 1700 10/29/13 0400 10/30/13 1128  Weight: 59.4 kg (130 lb 15.3 oz) 60.6 kg (133 lb 9.6 oz) 60.328 kg (133 lb)    Exam:   General:  confused  Cardiovascular: s1,s2 rrr  Respiratory: BL rales   Abdomen: soft  Musculoskeletal: LE edema   Data Reviewed: Basic Metabolic Panel:  Recent Labs Lab 10/28/13 1252 10/28/13 1722 10/28/13 2331 10/29/13 0317  NA 142  --  140 142  K 4.2  --  4.2 4.1  CL 101  --  104 105  CO2 28  --  25 28  GLUCOSE 121*  --  124* 126*  BUN 41*  --  50* 50*  CREATININE 1.66*  --  1.32 1.27  CALCIUM 9.6  --  8.9 8.7  MG  --  2.1 2.5  --   PHOS  --  4.0  --   --    Liver Function Tests:  Recent Labs Lab 10/28/13 1252 10/29/13 0317  AST 14 14  ALT 9 10  ALKPHOS 81 74  BILITOT 0.9 0.6  PROT 6.4 5.5*  ALBUMIN 2.6* 2.2*   No results found for this basename: LIPASE, AMYLASE,  in the last 168 hours No results found for this basename: AMMONIA,  in the last  168 hours CBC:  Recent Labs Lab 10/28/13 1252 10/29/13 0317  WBC 13.3* 10.0  NEUTROABS 11.7*  --   HGB 12.4* 12.1*  HCT 38.9* 36.4*  MCV 99.5 98.6  PLT 169 159   Cardiac Enzymes: No results found for this basename: CKTOTAL, CKMB, CKMBINDEX, TROPONINI,  in the last 168 hours BNP (last 3 results) No results found for this basename: PROBNP,  in the last 8760 hours CBG: No results found for this basename: GLUCAP,  in the last 168 hours  Recent Results (from the past 240 hour(s))  CULTURE, BLOOD (ROUTINE X 2)     Status: None   Collection Time    10/28/13 12:52 PM      Result Value Ref Range Status   Specimen Description BLOOD RIGHT ANTECUBITAL    Final   Special Requests BOTTLES DRAWN AEROBIC AND ANAEROBIC 5 CC EACH   Final   Culture  Setup Time     Final   Value: 10/28/2013 18:49     Performed at [facility]   Culture     Final   Value:        BLOOD CULTURE RECEIVED NO GROWTH TO DATE CULTURE WILL BE HELD FOR 5 DAYS BEFORE ISSUING A FINAL NEGATIVE REPORT     Performed at [facility]   Report Status PENDING   Incomplete  CULTURE, BLOOD (ROUTINE X 2)     Status: None   Collection Time    10/28/13  1:00 PM      Result Value Ref Range Status   Specimen Description BLOOD RIGHT ANTECUBITAL   Final   Special Requests BOTTLES DRAWN AEROBIC AND ANAEROBIC 2 CC   Final   Culture  Setup Time     Final   Value: 10/28/2013 18:48     Performed at [facility]   Culture     Final   Value:        BLOOD CULTURE RECEIVED NO GROWTH TO DATE CULTURE WILL BE HELD FOR 5 DAYS BEFORE ISSUING A FINAL NEGATIVE REPORT     Performed at [facility]   Report Status PENDING   Incomplete  URINE CULTURE     Status: None   Collection Time    10/28/13  1:53 PM      Result Value Ref Range Status   Specimen Description URINE, CATHETERIZED   Final   Special Requests NONE   Final   Culture  Setup Time     Final   Value: 10/28/2013 21:42     Performed at [facility]   Colony Count     Final   Value: >=100,000 COLONIES/ML     Performed at [facility]   Culture     Final   Value: METHICILLIN RESISTANT STAPHYLOCOCCUS AUREUS     Note: RIFAMPIN AND GENTAMICIN SHOULD NOT BE USED AS SINGLE DRUGS FOR TREATMENT OF STAPH INFECTIONS. CRITICAL RESULT CALLED TO, READ BACK BY AND VERIFIED WITH: Creed CopperKAITLYN M @ 12:40PM 10/30/13 BY DWEEKS     Performed at [facility]   Report Status 10/30/2013 FINAL   Final   Organism ID, Bacteria METHICILLIN RESISTANT STAPHYLOCOCCUS AUREUS   Final  MRSA PCR SCREENING     Status: Abnormal   Collection Time    10/28/13  5:10 PM      Result Value Ref Range Status   MRSA by PCR POSITIVE  (*) NEGATIVE Final   Comment:  The GeneXpert MRSA Assay (FDA     approved for NASAL specimens     only), is one component of a     comprehensive MRSA colonization     surveillance program. It is not     intended to diagnose MRSA     infection nor to guide or     monitor treatment for     MRSA infections.     RESULT CALLED TO, READ BACK BY AND VERIFIED WITH:     C.SMITH AT 1822 ON [id number] BY C.BONGEL  CULTURE, EXPECTORATED SPUTUM-ASSESSMENT     Status: None   Collection Time    10/28/13  9:55 PM      Result Value Ref Range Status   Specimen Description SPUTUM   Final   Special Requests Immunocompromised   Final   Sputum evaluation     Final   Value: THIS SPECIMEN IS ACCEPTABLE. RESPIRATORY CULTURE REPORT TO FOLLOW.   Report Status 10/28/2013 FINAL   Final  CULTURE, RESPIRATORY (NON-EXPECTORATED)     Status: None   Collection Time    10/28/13  9:55 PM      Result Value Ref Range Status   Specimen Description SPUTUM   Final   Special Requests NONE   Final   Gram Stain     Final   Value: ABUNDANT WBC PRESENT, PREDOMINANTLY PMN     FEW SQUAMOUS EPITHELIAL CELLS PRESENT     MODERATE YEAST     MODERATE GRAM POSITIVE RODS     FEW GRAM NEGATIVE RODS   Culture     Final   Value: Culture reincubated for better growth     Performed at [facility]   Report Status PENDING   Incomplete  CLOSTRIDIUM DIFFICILE BY PCR     Status: None   Collection Time    10/29/13  7:41 AM      Result Value Ref Range Status   C difficile by pcr NEGATIVE  NEGATIVE Final   Comment: Performed at [facility]     Studies: No results found.  Scheduled Meds: . aspirin  81 mg Oral Daily  . Chlorhexidine Gluconate Cloth  6 each Topical Q0600  . feeding supplement (ENSURE COMPLETE)  237 mL Oral TID BM  . heparin  5,000 Units Subcutaneous 3 times per day  . multivitamin with minerals  1 tablet Oral Daily  . multivitamin-lutein  1 capsule Oral Daily  . mupirocin ointment  1  application Nasal BID  . pantoprazole  40 mg Oral Daily  . piperacillin-tazobactam (ZOSYN)  IV  3.375 g Intravenous Q8H  . pneumococcal 23 valent vaccine  0.5 mL Intramuscular Tomorrow-1000  . polyvinyl alcohol  1 drop Both Eyes BID  . sodium chloride  3 mL Intravenous Q12H  . vancomycin  750 mg Intravenous Q24H   Continuous Infusions: . dextrose 5 % and 0.45 % NaCl with KCl 20 mEq/L 75 mL/hr at 10/30/13 2300    Principal Problem:   Sepsis Active Problems:   GERD   PACEMAKER, PERMANENT   Atrial fibrillation   Sepsis associated hypotension   Protein-calorie malnutrition, severe    Time spent: >35 minutes     Esperanza Sheets  [facility] Pager [phone]. If 7PM-7AM, please contact night-coverage at [email or web address], password [facility] 10/31/2013, 8:26 AM  LOS: 3 days

## 2013-10-31 NOTE — Progress Notes (Addendum)
Clinical Social Work Department CLINICAL SOCIAL WORK PLACEMENT NOTE 10/31/2013  Patient:  [name] [name],[name] [name]  Account Number:  [id number] Admit date:  10/28/2013  Clinical Social Worker:  Jacelyn GripSUZANNA BYRD, LCSWA  Date/time:  10/31/2013 02:44 PM  Clinical Social Work is seeking post-discharge placement for this patient at the following level of care:   SKILLED NURSING   (*CSW will update this form in Epic as items are completed)   10/31/2013  Patient/family provided with [facility] Department of Clinical Social Work's list of facilities offering this level of care within the geographic area requested by the patient (or if unable, by the patient's family).  10/31/2013  Patient/family informed of their freedom to choose among providers that offer the needed level of care, that participate in Medicare, Medicaid or managed care program needed by the patient, have an available bed and are willing to accept the patient.  10/31/2013  Patient/family informed of [facility]' ownership interest in [facility], as well as of the fact that they are under no obligation to receive care at this facility.  PASARR submitted to EDS on 10/31/2013 PASARR number received from EDS on 10/31/2013  FL2 transmitted to all facilities in geographic area requested by pt/family on  10/31/2013 FL2 transmitted to all facilities within larger geographic area on   Patient informed that his/her managed care company has contracts with or will negotiate with  certain facilities, including the following:     Patient/family informed of bed offers received:  11/01/2013 [facility] Physician recommends and patient chooses bed at    [facility]  [facility] on 11/02/2013 Patient to be transferred to facility by ambulance Sharin Mons(PTAR)  The following physician request were entered in Epic:   Additional Comments:   Loletta SpecterSuzanna Daviel Allegretto, MSW, LCSW Clinical Social Work [phone]

## 2013-10-31 NOTE — Progress Notes (Signed)
Clinical Social Work Department BRIEF PSYCHOSOCIAL ASSESSMENT 10/31/2013  Patient:  [name] [name], [name] [name]     Account Number:  [id number]     Admit date:  10/28/2013  Clinical Social Worker:  Ulyess Blossom  Date/Time:  10/31/2013 02:30 PM  Referred by:  CSW  Date Referred:  10/31/2013 Referred for  SNF Placement   Other Referral:   Interview type:  Patient Other interview type:   and patient wife at bedside    PSYCHOSOCIAL DATA Living Status:  WIFE Admitted from facility:   Level of care:   Primary support name:  Mikle Bosworth Riester/wife/[phone] Primary support relationship to patient:  SPOUSE Degree of support available:   strong    CURRENT CONCERNS Current Concerns  Post-Acute Placement   Other Concerns:    SOCIAL WORK ASSESSMENT / PLAN CSW reviewed chart and noted that PT/OT recommending SNF placement upon d/c.    CSW met with pt and pt wife at bedside. CSW introduced self and explained role. Pt wife discussed that pt and pt wife live together and pt daughter also resides with the couple. Pt wife discussed that pt is wheelchair bound at home. Pt wife reported that pt broke his hip around 2010 and was at a rehab facility [facility] at that time and then pt and pt wife moved to [place] and pt wife reports that pt can transfer with assistance to wheelchair, but has not been ambulating and pt daughter assist pt wife with care at home. CSW discussed recommendations for SNF and pt wife agreeable and confirms that pt wife and pt daughter would not be able to manage pt care needs at home at this time. Pt wife agreeable to [facility] search.    CSW completed FL2 and initiated SNF search to [facility].    CSW to follow up with pt and pt wife to provide SNF bed offers.    CSW to continue to follow and facilitate pt discharge needs when pt medically ready for discharge.   Assessment/plan status:  Psychosocial Support/Ongoing Assessment of Needs Other assessment/  plan:   discharge planning   Information/referral to community resources:   [facility]    PATIENT'S/FAMILY'S RESPONSE TO PLAN OF CARE: Pt alert and oriented x 4, however pt resting during assessment and has history of dementia. Pt wife supportive and actively involved in pt care and appears realistic about pt needs and that it would be difficult for pt and pt daughter to manage pt care at home. Pt wife appreciative of CSW support and assistance and eager to review SNF options.    Alison Murray, MSW, [place] Work [phone]

## 2013-10-31 NOTE — Evaluation (Signed)
Physical Therapy Evaluation Patient Details Name: [name] [name] MRN: [id number] DOB: 11-18-1917 Today's Date: 10/31/2013   History of Present Illness  Pt 80 year old male with PMH of afib, s/p PPM, dementia, history of hip fracture status post wheelchair-bound, benign prostatic hypertrophy and admitted with sepsis, pneumonia, UTI.  Clinical Impression  Pt currently with functional limitations due to the deficits listed below (see PT Problem List).  Pt will benefit from skilled PT to increase their independence and safety with mobility to allow discharge to the venue listed below.  Pt received Haldol per RN 45 min prior to session and pt lethargic however agreeable to sit EOB.  Upon sitting EOB pt became dizzy, this didn't resolve, and pt requested return to supine.  Pt poor historian and unknown baseline as no family in room however per chart review appears pt from home with spouse and able to transfer to w/c.  Recommend SNF at this time due to decreased mobility and weakness.     Follow Up Recommendations SNF    Equipment Recommendations  Hospital bed    Recommendations for Other Services       Precautions / Restrictions Precautions Precautions: Fall Precaution Comments: multiple pressure ulcers      Mobility  Bed Mobility Overal bed mobility: Needs Assistance;+2 for physical assistance Bed Mobility: Supine to Sit;Sit to Supine     Supine to sit: Max assist;+2 for physical assistance Sit to supine: Max assist;+2 for physical assistance   General bed mobility comments: pt required assist for lower body however able to assist a little with trunk using UEs, pt reported feeling dizzy upon sitting EOB, dizziness did not subside and pt requested to return to supine.  Transfers                 General transfer comment: pt declined  Ambulation/Gait                Stairs            Wheelchair Mobility    Modified Rankin (Stroke Patients Only)        Balance Overall balance assessment: History of Falls;Needs assistance   Sitting balance-Leahy Scale: Fair                                       Pertinent Vitals/Pain Dizzy upon sitting and unable to tolerate so assisted back to supine, no reports of pain during mobility    Home Living Family/patient expects to be discharged to:: Private residence Living Arrangements: Spouse/significant other;Children             Home Equipment: Wheelchair - manual Additional Comments: pt poor historian    Prior Function Level of Independence: Independent with assistive device(s)         Comments: per chart usually able to assist with transfers to w/c     Hand Dominance        Extremity/Trunk Assessment   Upper Extremity Assessment: Defer to OT evaluation           Lower Extremity Assessment: Generalized weakness         Communication   Communication: HOH  Cognition Arousal/Alertness: Lethargic;Suspect due to medications (received haldol 45 min prior to session) Behavior During Therapy: [facility] for tasks assessed/performed Overall Cognitive Status: No family/caregiver present to determine baseline cognitive functioning  General Comments General comments (skin integrity, edema, etc.): multiple pressure ulcers, bil LE wasting    Exercises        Assessment/Plan    PT Assessment Patient needs continued PT services  PT Diagnosis Generalized weakness   PT Problem List Decreased strength;Decreased activity tolerance;Decreased mobility;Decreased knowledge of use of DME;Decreased skin integrity  PT Treatment Interventions DME instruction;Patient/family education;Functional mobility training;Therapeutic activities;Therapeutic exercise;Wheelchair mobility training;Neuromuscular re-education;Balance training   PT Goals (Current goals can be found in the Care Plan section) Acute Rehab PT Goals PT Goal Formulation: With patient Time  For Goal Achievement: 11/14/13 Potential to Achieve Goals: Fair    Frequency Min 2X/week   Barriers to discharge        Co-evaluation               End of Session Equipment Utilized During Treatment: Gait belt Activity Tolerance: Patient limited by fatigue Patient left: in bed;with call bell/phone within reach;with bed alarm set           Time: [id number] PT Time Calculation (min): 10 min   Charges:   PT Evaluation $Initial PT Evaluation Tier I: 1 Procedure PT Treatments $Therapeutic Activity: 8-22 mins   PT G Codes:          Dhyana Bastone,KATHrine E 10/31/2013, 12:31 PM Zenovia JarredKati Zyanna Leisinger, PT, DPT 10/31/2013 Pager: [phone]

## 2013-11-01 ENCOUNTER — Encounter (HOSPITAL_COMMUNITY): Payer: Self-pay | Admitting: Internal Medicine

## 2013-11-01 DIAGNOSIS — E43 Unspecified severe protein-calorie malnutrition: Secondary | ICD-10-CM

## 2013-11-01 DIAGNOSIS — N179 Acute kidney failure, unspecified: Secondary | ICD-10-CM | POA: Diagnosis present

## 2013-11-01 DIAGNOSIS — F0391 Unspecified dementia with behavioral disturbance: Secondary | ICD-10-CM

## 2013-11-01 DIAGNOSIS — F03918 Unspecified dementia, unspecified severity, with other behavioral disturbance: Secondary | ICD-10-CM

## 2013-11-01 DIAGNOSIS — F0390 Unspecified dementia without behavioral disturbance: Secondary | ICD-10-CM

## 2013-11-01 DIAGNOSIS — I5032 Chronic diastolic (congestive) heart failure: Secondary | ICD-10-CM | POA: Diagnosis present

## 2013-11-01 DIAGNOSIS — L8990 Pressure ulcer of unspecified site, unspecified stage: Secondary | ICD-10-CM | POA: Diagnosis present

## 2013-11-01 DIAGNOSIS — J9601 Acute respiratory failure with hypoxia: Secondary | ICD-10-CM | POA: Diagnosis present

## 2013-11-01 DIAGNOSIS — R410 Disorientation, unspecified: Secondary | ICD-10-CM | POA: Diagnosis present

## 2013-11-01 DIAGNOSIS — Z22322 Carrier or suspected carrier of Methicillin resistant Staphylococcus aureus: Secondary | ICD-10-CM

## 2013-11-01 DIAGNOSIS — I350 Nonrheumatic aortic (valve) stenosis: Secondary | ICD-10-CM | POA: Diagnosis present

## 2013-11-01 DIAGNOSIS — R404 Transient alteration of awareness: Secondary | ICD-10-CM

## 2013-11-01 DIAGNOSIS — N390 Urinary tract infection, site not specified: Secondary | ICD-10-CM | POA: Diagnosis present

## 2013-11-01 DIAGNOSIS — J9600 Acute respiratory failure, unspecified whether with hypoxia or hypercapnia: Secondary | ICD-10-CM

## 2013-11-01 DIAGNOSIS — A4902 Methicillin resistant Staphylococcus aureus infection, unspecified site: Secondary | ICD-10-CM

## 2013-11-01 DIAGNOSIS — R636 Underweight: Secondary | ICD-10-CM

## 2013-11-01 HISTORY — DX: Carrier or suspected carrier of methicillin resistant Staphylococcus aureus: Z22.322

## 2013-11-01 HISTORY — DX: Nonrheumatic aortic (valve) stenosis: I35.0

## 2013-11-01 HISTORY — DX: Underweight: R63.6

## 2013-11-01 HISTORY — DX: Unspecified dementia with behavioral disturbance: F03.91

## 2013-11-01 HISTORY — DX: Unspecified dementia, unspecified severity, with other behavioral disturbance: F03.918

## 2013-11-01 LAB — BASIC METABOLIC PANEL
BUN: 25 mg/dL — AB (ref 6–23)
CO2: 24 meq/L (ref 19–32)
CREATININE: 0.87 mg/dL (ref 0.50–1.35)
Calcium: 8.8 mg/dL (ref 8.4–10.5)
Chloride: 107 mEq/L (ref 96–112)
GFR calc Af Amer: 84 mL/min — ABNORMAL LOW (ref >90)
GFR, EST NON AFRICAN AMERICAN: 72 mL/min — AB (ref >90)
GLUCOSE: 147 mg/dL — AB (ref 70–99)
Potassium: 3.7 mEq/L (ref 3.7–5.3)
Sodium: 144 mEq/L (ref 137–147)

## 2013-11-01 LAB — CBC
HCT: 36.5 % — ABNORMAL LOW (ref 39.0–52.0)
Hemoglobin: 12.5 g/dL — ABNORMAL LOW (ref 13.0–17.0)
MCH: 32.6 pg (ref 26.0–34.0)
MCHC: 34.2 g/dL (ref 30.0–36.0)
MCV: 95.3 fL (ref 78.0–100.0)
Platelets: 176 10*3/uL (ref 150–400)
RBC: 3.83 MIL/uL — AB (ref 4.22–5.81)
RDW: 15.6 % — ABNORMAL HIGH (ref 11.5–15.5)
WBC: 11.8 10*3/uL — ABNORMAL HIGH (ref 4.0–10.5)

## 2013-11-01 MED ORDER — LORAZEPAM 2 MG/ML IJ SOLN
1.0000 mg | Freq: Once | INTRAMUSCULAR | Status: AC
  Administered 2013-11-01: 1 mg via INTRAVENOUS

## 2013-11-01 MED ORDER — DIPHENOXYLATE-ATROPINE 2.5-0.025 MG PO TABS: 2.0000 | ORAL_TABLET | Freq: Four times a day (QID) | ORAL | Status: DC | PRN

## 2013-11-01 MED ORDER — LORAZEPAM 2 MG/ML IJ SOLN
INTRAMUSCULAR | Status: AC
  Filled 2013-11-01: qty 1

## 2013-11-01 MED ORDER — VANCOMYCIN HCL 500 MG IV SOLR
500.0000 mg | Freq: Two times a day (BID) | INTRAVENOUS | Status: DC
  Administered 2013-11-01 – 2013-11-02 (×2): 500 mg via INTRAVENOUS
  Filled 2013-11-01 (×4): qty 500

## 2013-11-01 NOTE — Progress Notes (Signed)
CSW continuing to follow for disposition planning.  CSW met with pt and pt wife at bedside. Pt sleeping soundly at this time.  CSW discussed with pt wife about bed offers and pt wife [facility] bed [facility].  CSW contacted [facility] and confirmed facility can accept pt and updated facility that pt having PICC line placed for IV antibiotics. [facility] confirmed that they can meet this need and stated that facility is ordering an air mattress for the facility.  Anticipate discharge tomorrow, Thursday 11/02/2013.   CSW to continue to follow and assist with pt disposition to [facility].  Alison Murray, MSW, [place] Work [phone]

## 2013-11-01 NOTE — Progress Notes (Signed)
Progress Note   MUAAZ BRAU ZOX:[id number] DOB: February 06, 1918 DOA: 10/28/2013 PCP: Rogelia Boga, MD   Brief Narrative:    [name] [name] is an 80 y.o. male with a PMH of atrial fibrillation, pacemaker, history of hip fracture and non-ambulatory/wheelchair-bound at baseline, BPH and dementia who was admitted on 10/28/13 with difficulty breathing secondary to right upper lobe pneumonia.   Assessment/Plan:    Principal Problem:   Sepsis secondary to right upper lobe pneumonia and MRSA UTI Patient was admitted and a diagnostic workup was undertaken. Patient was septic with hypotension and lactic acidosis so he was treated with broad-spectrum antibiotics including vancomycin and Zosyn. He was initially monitored on the step down unit, transferred to the floor when he was hemodynamically stable. His blood pressure responded to fluid volume resuscitation. Blood cultures are negative to date. Urine cultures grew MRSA. Sputum cultures grew mixed flora. Strep pneumonia and Legionella antigens were negative.  C. difficile studies were negative. Continue current antibiotics, bronchodilators, oxygen support. Echocardiogram done 10/30/13 showed a possible aortic valve vegetation. Family declined further evaluation with TEE. Awaiting ID recommendations for empiric antibiotic treatment/duration of therapy. Active Problems:   Diarrhea C. difficile negative. We'll start on Lomotil.   Decubitus ulcers Wound care per wound care RN recommendations.   Chronic diastolic CHF / aortic stenosis Echocardiogram done 10/30/13 with EF of 55%. Aortic stenosis with a mean gradient of 16 mm Hg. Resume home diuretics soon.   Acute hypoxic respiratory failure Secondary to pneumonia. Improved with empiric treatment as noted above. Maintaining oxygen saturations in the mid 90s.    Acute kidney injury Likely from sepsis-induced hypotension. Creatinine 1.66 on admission, improved to 0.87 with fluid volume  resuscitation.    GERD Continue PPI therapy.   PACEMAKER, PERMANENT / Atrial fibrillation The patient is not a candidate for anticoagulation secondary to fall and bleeding risk. He is status post permanent pacemaker. Heart rate is stable. Continue aspirin.   Sepsis associated hypotension Hemodynamically stable status post fluid volume resuscitation.   Protein-calorie malnutrition, severe / underweight Seen by dietitian 10/30/13. Continue Ensure supplements and dysphagia 3 diet.   Delirium/dementia with behavioral disturbance Continue as needed Haldol and supportive care.   DVT Prophylaxis Continue heparin.  Code Status: DNR Family Communication: Wife, Gualberto Wahlen [phone]) Disposition Plan: SNF: [id number].   IV access:  Peripheral IV  Procedures  2-D echo 10/30/13: EF 55%. Possible aortic valve vegetation. Moderate aortic stenosis with a peak velocity of 229 cm/S., mean gradient 16 mm Hg. Right ventricular systolic dysfunction.  Medical Consultants:  None.  Other Consultants:  Dietitian  Physical therapy  Occupational therapy  Anti-infectives:  None.  Subjective:    [name] [name] is non-verbal this morning. Nursing staff reports that he is combative and confused at times. Poor appetite. 4 stools documented over the past 24 hours.  Objective:    Filed Vitals:   10/31/13 0507 10/31/13 1400 10/31/13 2206 11/01/13 0620  BP: 168/73 165/61 170/88 116/58  Pulse: 74 62 108 94  Temp: 97.5 F (36.4 C) 97 F (36.1 C) 97.6 F (36.4 C) 97.7 F (36.5 C)  TempSrc: Oral Axillary Oral Oral  Resp: 22 18 18 18   Height:      Weight:      SpO2: 95% 95% 93% 95%    Intake/Output Summary (Last 24 hours) at 11/01/13 0942 Last data filed at 11/01/13 0625  Gross per 24 hour  Intake 2556.25 ml  Output    400 ml  Net 2156.25 ml    Exam: Gen:  NAD, lethargic Cardiovascular:  RRR, No M/R/G Respiratory:  Lungs diminished in the bases Gastrointestinal:  Abdomen soft,  NT/ND, + BS Extremities:  No C/E/C   Data Reviewed:    Labs: Basic Metabolic Panel:  Recent Labs Lab 10/28/13 1252 10/28/13 1722 10/28/13 2331 10/29/13 0317 10/31/13 1815 11/01/13 0330  NA 142  --  140 142 142 144  K 4.2  --  4.2 4.1 4.0 3.7  CL 101  --  104 105 102 107  CO2 28  --  25 28 25 24   GLUCOSE 121*  --  124* 126* 211* 147*  BUN 41*  --  50* 50* 27* 25*  CREATININE 1.66*  --  1.32 1.27 0.86 0.87  CALCIUM 9.6  --  8.9 8.7 8.6 8.8  MG  --  2.1 2.5  --   --   --   PHOS  --  4.0  --   --   --   --    GFR Estimated Creatinine Clearance: 45.2 ml/min (by C-G formula based on Cr of 0.87). Liver Function Tests:  Recent Labs Lab 10/28/13 1252 10/29/13 0317  AST 14 14  ALT 9 10  ALKPHOS 81 74  BILITOT 0.9 0.6  PROT 6.4 5.5*  ALBUMIN 2.6* 2.2*   CBC:  Recent Labs Lab 10/28/13 1252 10/29/13 0317 10/31/13 1815 11/01/13 0330  WBC 13.3* 10.0 7.4 11.8*  NEUTROABS 11.7*  --  6.4  --   HGB 12.4* 12.1* 11.7* 12.5*  HCT 38.9* 36.4* 34.9* 36.5*  MCV 99.5 98.6 96.7 95.3  PLT 169 159 148* 176   Sepsis Labs:  Recent Labs Lab 10/28/13 1252 10/28/13 1308 10/29/13 0317 10/31/13 1815 11/01/13 0330  WBC 13.3*  --  10.0 7.4 11.8*  LATICACIDVEN  --  3.75*  --   --   --    Microbiology Recent Results (from the past 240 hour(s))  CULTURE, BLOOD (ROUTINE X 2)     Status: None   Collection Time    10/28/13 12:52 PM      Result Value Ref Range Status   Specimen Description BLOOD RIGHT ANTECUBITAL   Final   Special Requests BOTTLES DRAWN AEROBIC AND ANAEROBIC 5 CC EACH   Final   Culture  Setup Time     Final   Value: 10/28/2013 18:49     Performed at [facility]   Culture     Final   Value:        BLOOD CULTURE RECEIVED NO GROWTH TO DATE CULTURE WILL BE HELD FOR 5 DAYS BEFORE ISSUING A FINAL NEGATIVE REPORT     Performed at [facility]   Report Status PENDING   Incomplete  CULTURE, BLOOD (ROUTINE X 2)     Status: None   Collection Time     10/28/13  1:00 PM      Result Value Ref Range Status   Specimen Description BLOOD RIGHT ANTECUBITAL   Final   Special Requests BOTTLES DRAWN AEROBIC AND ANAEROBIC 2 CC   Final   Culture  Setup Time     Final   Value: 10/28/2013 18:48     Performed at [facility]   Culture     Final   Value:        BLOOD CULTURE RECEIVED NO GROWTH TO DATE CULTURE WILL BE HELD FOR 5 DAYS BEFORE ISSUING A FINAL NEGATIVE REPORT     Performed at [facility]   Report Status PENDING   Incomplete  URINE CULTURE     Status: None   Collection Time    10/28/13  1:53 PM      Result Value Ref Range Status   Specimen Description URINE, CATHETERIZED   Final   Special Requests NONE   Final   Culture  Setup Time     Final   Value: 10/28/2013 21:42     Performed at [facility] Count     Final   Value: >=100,000 COLONIES/ML     Performed at [facility]   Culture     Final   Value: METHICILLIN RESISTANT STAPHYLOCOCCUS AUREUS     Note: RIFAMPIN AND GENTAMICIN SHOULD NOT BE USED AS SINGLE DRUGS FOR TREATMENT OF STAPH INFECTIONS. CRITICAL RESULT CALLED TO, READ BACK BY AND VERIFIED WITH: Creed Copper @ 12:40PM 10/30/13 BY DWEEKS     Performed at [facility]   Report Status 10/30/2013 FINAL   Final   Organism ID, Bacteria METHICILLIN RESISTANT STAPHYLOCOCCUS AUREUS   Final  MRSA PCR SCREENING     Status: Abnormal   Collection Time    10/28/13  5:10 PM      Result Value Ref Range Status   MRSA by PCR POSITIVE (*) NEGATIVE Final   Comment:            The GeneXpert MRSA Assay (FDA     approved for NASAL specimens     only), is one component of a     comprehensive MRSA colonization     surveillance program. It is not     intended to diagnose MRSA     infection nor to guide or     monitor treatment for     MRSA infections.     RESULT CALLED TO, READ BACK BY AND VERIFIED WITH:     C.SMITH AT 1822 ON [id number] BY C.BONGEL  CULTURE, EXPECTORATED SPUTUM-ASSESSMENT      Status: None   Collection Time    10/28/13  9:55 PM      Result Value Ref Range Status   Specimen Description SPUTUM   Final   Special Requests Immunocompromised   Final   Sputum evaluation     Final   Value: THIS SPECIMEN IS ACCEPTABLE. RESPIRATORY CULTURE REPORT TO FOLLOW.   Report Status 10/28/2013 FINAL   Final  CULTURE, RESPIRATORY (NON-EXPECTORATED)     Status: None   Collection Time    10/28/13  9:55 PM      Result Value Ref Range Status   Specimen Description SPUTUM   Final   Special Requests NONE   Final   Gram Stain     Final   Value: ABUNDANT WBC PRESENT, PREDOMINANTLY PMN     FEW SQUAMOUS EPITHELIAL CELLS PRESENT     MODERATE YEAST     MODERATE GRAM POSITIVE RODS     FEW GRAM NEGATIVE RODS   Culture     Final   Value: RARE CANDIDA ALBICANS     Performed at [facility]   Report Status 10/31/2013 FINAL   Final  CLOSTRIDIUM DIFFICILE BY PCR     Status: None   Collection Time    10/29/13  7:41 AM      Result Value Ref Range Status   C difficile by pcr NEGATIVE  NEGATIVE Final   Comment: Performed at [facility]  CULTURE, BLOOD (ROUTINE X 2)     Status: None  Collection Time    10/31/13  6:15 PM      Result Value Ref Range Status   Specimen Description BLOOD RIGHT ARM   Final   Special Requests BOTTLES DRAWN AEROBIC AND ANAEROBIC 10CC   Final   Culture  Setup Time     Final   Value: 10/31/2013 23:10     Performed at [facility]   Culture     Final   Value:        BLOOD CULTURE RECEIVED NO GROWTH TO DATE CULTURE WILL BE HELD FOR 5 DAYS BEFORE ISSUING A FINAL NEGATIVE REPORT     Performed at [facility]   Report Status PENDING   Incomplete  CULTURE, BLOOD (ROUTINE X 2)     Status: None   Collection Time    10/31/13  6:25 PM      Result Value Ref Range Status   Specimen Description BLOOD LEFT ARM   Final   Special Requests BOTTLES DRAWN AEROBIC AND ANAEROBIC 10CC   Final   Culture  Setup Time     Final   Value: 10/31/2013  23:10     Performed at [facility]   Culture     Final   Value:        BLOOD CULTURE RECEIVED NO GROWTH TO DATE CULTURE WILL BE HELD FOR 5 DAYS BEFORE ISSUING A FINAL NEGATIVE REPORT     Performed at [facility]   Report Status PENDING   Incomplete     Radiographs/Studies:    Dg Chest Port 1 View  10/28/2013   CLINICAL DATA:  Shortness  EXAM: PORTABLE CHEST - 1 VIEW  COMPARISON:  None.  FINDINGS: Cardiac shadow is within normal limits. The patient significant rotated to the right. Patchy right upper lobe infiltrate is seen consistent with acute pneumonia. No other focal infiltrate is noted. A pacing device is seen.  IMPRESSION: Right upper lobe pneumonia.   Electronically Signed   By: Alcide Clever M.D.   On: 10/28/2013 13:47    Medications:    . aspirin  81 mg Oral Daily  . Chlorhexidine Gluconate Cloth  6 each Topical Q0600  . feeding supplement (ENSURE COMPLETE)  237 mL Oral TID BM  . heparin  5,000 Units Subcutaneous 3 times per day  . multivitamin with minerals  1 tablet Oral Daily  . multivitamin-lutein  1 capsule Oral Daily  . mupirocin ointment  1 application Nasal BID  . pantoprazole  40 mg Oral Daily  . piperacillin-tazobactam (ZOSYN)  IV  3.375 g Intravenous Q8H  . pneumococcal 23 valent vaccine  0.5 mL Intramuscular Tomorrow-1000  . polyvinyl alcohol  1 drop Both Eyes BID  . sodium chloride  3 mL Intravenous Q12H  . vancomycin  500 mg Intravenous Q12H   Continuous Infusions: . dextrose 5 % and 0.45 % NaCl with KCl 20 mEq/L 75 mL/hr at 10/30/13 2300    Time spent: 35 minutes with > 50% of time discussing current diagnostic test results, clinical impression and plan of care with the patient's wife.    LOS: 4 days   Maryruth Bun Rama  [facility] Pager [phone]. If unable to reach me by pager, please call my cell phone at [phone].  *Please refer to [email or web address], password [email or web address] to get updated schedule on who will round on this patient, as  hospitalists switch teams weekly. If 7PM-7AM, please contact night-coverage at [email or web address], password [email or web address] for any overnight needs.  11/01/2013, 9:42 AM    **  Disclaimer: This note was dictated with voice recognition software. Similar sounding words can inadvertently be transcribed and this note may contain transcription errors which may not have been corrected upon publication of note.**

## 2013-11-01 NOTE — Progress Notes (Addendum)
ANTIBIOTIC CONSULT NOTE - FOLLOW UP  Pharmacy Consult for vancomycin and Zosyn Indication: sepsis, ?endocarditis  Allergies  Allergen Reactions  . Cortisone Acetate     REACTION: reacted to injection in back    Patient Measurements: Height: 6' (182.9 cm) Weight: 133 lb (60.328 kg) IBW/kg (Calculated) : 77.6  Vital Signs: Temp: 97.7 F (36.5 C) (04/08 0620) Temp src: Oral (04/08 0620) BP: 116/58 mmHg (04/08 0620) Pulse Rate: 94 (04/08 0620)  Assessment: 89 yoM admitted 4/4 from home with difficulty breathing and CXR shows RUL pneumonia. Pt was hypotensive in ED, responded to saline infusion and is admitted with sepsis. Pharmacy was initially consulted to dose ceftriaxone and azithromycin, but coverage has been broadened to vancomycin and Zosyn.  Noted vancomycin dose due on 4/5 not charted, safety zone entered  Antiinfectives D5 Zosyn, "D3" vancomycin 4/4 >> ceftriaxone >> 4/4 4/4 >> azith >> 4/4 4/4 >> vancomycin x1 4/6 >> vancomycin >>  4/4 >> Zosyn >>  Labs/ vitals  Tmax: remains afebrile WBCs: slight incr to 11.8k Renal: SCr improved to 0.87, improved, CrCl ~45 ml/min CG, 54 ml/min normlaized, UOP 0.3 ml/kg/hr Lactic acid: 3.75 (4/4)  Microbiology 4/4 blood x2: ngtd 4/4 urine: >100k MRSA (S=Macrobid, vanc; I=gent, rifampin; R=LVQ, tetra, Bactrim) 4/4 S pneumo Ur Ag: negative 4/4 Legionella Ur Ag: negative 4/4 Sputum: rare Candida. 4/5 Cdiff: negative 4/7 blood x2: sent  Drug level / dose changes info: 4/7 random vancomycin level = 25.5 mcg/ml- drawn 4 hours after 2nd vancomycin dose given   Goal of Therapy:  Vancomycin trough level 15-20 mcg/ml eradication of infection  Plan:  - increase vancomycin to 500mg  IV q12h with improved renal function - continue Zosyn 3.375G IV q8h, each dose to be infused over 4 hours - vancomycin trough at steady state - follow-up clinical course, culture results, renal function - follow-up antibiotic de-escalation and  length of therapy  Thank you for the consult.  Tomi BambergerJesse Sarahi Borland, PharmD, BCPS Pager: [phone] Pharmacy: [phone] 11/01/2013 9:00 AM

## 2013-11-01 NOTE — Progress Notes (Signed)
CARE MANAGEMENT NOTE 11/01/2013  Patient:  Asa SaunasHALL,Axel E   Account Number:  [id number]  Date Initiated:  11/01/2013  Documentation initiated by:  Donnalee Cellucci  Subjective/Objective Assessment:   first dau woth this patient for this cm.  Patient admitted due to pna and ams, continues to have elevated wbc, confusion, sputum is positive for gram-rods, new bld cultures pending.     Action/Plan:   [facility].   Anticipated DC Date:  11/04/2013   Anticipated DC Plan:  SKILLED NURSING FACILITY  In-house referral  Clinical Social Worker      DC Planning Services  NA      [facility]   Choice offered to / List presented to:  NA   DME arranged  NA      DME agency  NA     HH arranged  NA      HH agency  NA   Status of service:  In process, will continue to follow Medicare Important Message given?  NA - LOS <3 / Initial given by admissions (If response is "NO", the following Medicare IM given date fields will be blank) Date Medicare IM given:   Date Additional Medicare IM given:    Discharge Disposition:    Per UR Regulation:  Reviewed for med. necessity/level of care/duration of stay  If discussed at Long Length of Stay Meetings, dates discussed:    Comments:  [id number]/Cierrah Dace Stark JockDavis, RN, BSN, [place] [phone] Chart Reviewed for discharge and hospital needs. Discharge needs at time of review: None present will follow for needs. Review of patient progress due on [id number].

## 2013-11-01 NOTE — Consult Note (Signed)
[facility]  Total days of antibiotics 5        Day 5 vancomycin        Day 5 piptazo               Reason for Consult: mrsa uti concern for invasive disease such as IE    Referring Physician: rama  Principal Problem:   Sepsis Active Problems:   GERD   PACEMAKER, PERMANENT   Atrial fibrillation   Sepsis associated hypotension   Protein-calorie malnutrition, severe   Chronic diastolic CHF (congestive heart failure)   Aortic stenosis   Acute respiratory failure with hypoxia   Acute kidney injury   Underweight   Acute delirium   Dementia with behavioral disturbance   Decubitus ulcers   UTI (urinary tract infection)   MRSA carrier    HPI: [name] [name] is a 80 y.o. male with dementia, HTN, afib, permanent pacemaker admitted with AMS, sepsis on 10/28/13, he was found to have pneumonia, and MRSA UTI. He was started with vancomycin/piptazo. He did have blood cx drawn prior abtx which were no growth to date. Due to past HX of CHF, he had TTE that showed a heavy calcified AV that could not rule out vegetation. Mod AS, with mild AI. Pacer wire in RV, no visible veg on lead. He had repeat blood cx on 4/7 which are NGTD while on antibiotics. He appears impoving, remains afebrile.  Past Medical History  Diagnosis Date  . ATHEROSCLEROSIS W /INT CLAUDICATION 06/06/2008  . AV BLOCK, COMPLETE 06/06/2008  . BENIGN PROSTATIC HYPERTROPHY, WITH OBSTRUCTION 09/02/2009  . DVT, HX OF 06/04/2009  . GERD 05/16/2007  . HYPERTENSION 05/16/2007  . OSTEOARTHRITIS, KNEES, BILATERAL 07/12/2008  . PACEMAKER, PERMANENT 06/06/2008  . Pruritus   . Decubitus ulcer, buttock   . Decubitus ulcer, lower back   . Atrial fibrillation   . PVD (peripheral vascular disease)   . Barrett esophagus   . MRSA carrier 11/01/2013  . Aortic stenosis 11/01/2013  . Dementia with behavioral disturbance 11/01/2013  . Protein-calorie malnutrition, severe 10/30/2013  . Underweight 11/01/2013    Allergies:   Allergies  Allergen Reactions  . Cortisone Acetate     REACTION: reacted to injection in back     MEDICATIONS: . aspirin  81 mg Oral Daily  . Chlorhexidine Gluconate Cloth  6 each Topical Q0600  . feeding supplement (ENSURE COMPLETE)  237 mL Oral TID BM  . heparin  5,000 Units Subcutaneous 3 times per day  . multivitamin with minerals  1 tablet Oral Daily  . multivitamin-lutein  1 capsule Oral Daily  . mupirocin ointment  1 application Nasal BID  . pantoprazole  40 mg Oral Daily  . piperacillin-tazobactam (ZOSYN)  IV  3.375 g Intravenous Q8H  . pneumococcal 23 valent vaccine  0.5 mL Intramuscular Tomorrow-1000  . polyvinyl alcohol  1 drop Both Eyes BID  . sodium chloride  3 mL Intravenous Q12H  . vancomycin  500 mg Intravenous Q12H    History  Substance Use Topics  . Smoking status: Former Smoker    Quit date: 07/27/1944  . Smokeless tobacco: Never Used  . Alcohol Use: No    No family history on file.  Review of Systems - Unable to obtain reliably due to altered mental status  OBJECTIVE: Temp:  [97 F (36.1 C)-97.7 F (36.5 C)] 97.7 F (36.5 C) (04/08 0620) Pulse Rate:  [62-108] 94 (04/08 0620) Resp:  [18] 18 (04/08 1093)  BP: (116-170)/(58-88) 116/58 mmHg (04/08 0620) SpO2:  [93 %-95 %] 95 % (04/08 5284) Physical Exam  Constitutional:  oriented to person only. He appears well-developed and well-nourished. No distress.appears stated age  HENT:  Mouth/Throat: Oropharynx is clear and moist. No oropharyngeal exudate.  Cardiovascular: Normal rate, regular rhythm and normal heart sounds. Exam reveals no gallop and no friction rub.  2/6 SEM at USB Pulmonary/Chest: Effort normal and breath sounds normal. No respiratory distress.  no wheezes.  Abdominal: Soft. Bowel sounds are normal. He exhibits no distension. There is no tenderness.  Lymphadenopathy:  no cervical adenopathy.  Neurological: He is alert and oriented to person only. Moves all extremities non focal.    Skin: Skin is warm and dry. No rash noted. No erythema.     LABS: Results for orders placed during the hospital encounter of 10/28/13 (from the past 48 hour(s))  CBC WITH DIFFERENTIAL     Status: Abnormal   Collection Time    10/31/13  6:15 PM      Result Value Ref Range   WBC 7.4  4.0 - 10.5 K/uL   RBC 3.61 (*) 4.22 - 5.81 MIL/uL   Hemoglobin 11.7 (*) 13.0 - 17.0 g/dL   HCT 34.9 (*) 39.0 - 52.0 %   MCV 96.7  78.0 - 100.0 fL   MCH 32.4  26.0 - 34.0 pg   MCHC 33.5  30.0 - 36.0 g/dL   RDW 15.8 (*) 11.5 - 15.5 %   Platelets 148 (*) 150 - 400 K/uL   Neutrophils Relative % 86 (*) 43 - 77 %   Neutro Abs 6.4  1.7 - 7.7 K/uL   Lymphocytes Relative 9 (*) 12 - 46 %   Lymphs Abs 0.7  0.7 - 4.0 K/uL   Monocytes Relative 4  3 - 12 %   Monocytes Absolute 0.3  0.1 - 1.0 K/uL   Eosinophils Relative 1  0 - 5 %   Eosinophils Absolute 0.1  0.0 - 0.7 K/uL   Basophils Relative 0  0 - 1 %   Basophils Absolute 0.0  0.0 - 0.1 K/uL  BASIC METABOLIC PANEL     Status: Abnormal   Collection Time    10/31/13  6:15 PM      Result Value Ref Range   Sodium 142  137 - 147 mEq/L   Potassium 4.0  3.7 - 5.3 mEq/L   Chloride 102  96 - 112 mEq/L   CO2 25  19 - 32 mEq/L   Glucose, Bld 211 (*) 70 - 99 mg/dL   BUN 27 (*) 6 - 23 mg/dL   Creatinine, Ser 0.86  0.50 - 1.35 mg/dL   Calcium 8.6  8.4 - 10.5 mg/dL   GFR calc non Af Amer 72 (*) >90 mL/min   GFR calc Af Amer 84 (*) >90 mL/min   Comment: (NOTE)     The eGFR has been calculated using the CKD EPI equation.     This calculation has not been validated in all clinical situations.     eGFR's persistently <90 mL/min signify possible Chronic Kidney     Disease.  VANCOMYCIN, RANDOM     Status: None   Collection Time    10/31/13  6:15 PM      Result Value Ref Range   Vancomycin Rm 25.5     Comment:            Random Vancomycin therapeutic     range is dependent on dosage and  time of specimen collection.     A peak range is 20.0-40.0 ug/mL     A  trough range is 5.0-15.0 ug/mL             CULTURE, BLOOD (ROUTINE X 2)     Status: None   Collection Time    10/31/13  6:15 PM      Result Value Ref Range   Specimen Description BLOOD RIGHT ARM     Special Requests BOTTLES DRAWN AEROBIC AND ANAEROBIC 10CC     Culture  Setup Time       Value: 10/31/2013 23:10     Performed at [facility]   Culture       Value:        BLOOD CULTURE RECEIVED NO GROWTH TO DATE CULTURE WILL BE HELD FOR 5 DAYS BEFORE ISSUING A FINAL NEGATIVE REPORT     Performed at [facility]   Report Status PENDING    CULTURE, BLOOD (ROUTINE X 2)     Status: None   Collection Time    10/31/13  6:25 PM      Result Value Ref Range   Specimen Description BLOOD LEFT ARM     Special Requests BOTTLES DRAWN AEROBIC AND ANAEROBIC 10CC     Culture  Setup Time       Value: 10/31/2013 23:10     Performed at [facility]   Culture       Value:        BLOOD CULTURE RECEIVED NO GROWTH TO DATE CULTURE WILL BE HELD FOR 5 DAYS BEFORE ISSUING A FINAL NEGATIVE REPORT     Performed at [facility]   Report Status PENDING    CBC     Status: Abnormal   Collection Time    11/01/13  3:30 AM      Result Value Ref Range   WBC 11.8 (*) 4.0 - 10.5 K/uL   RBC 3.83 (*) 4.22 - 5.81 MIL/uL   Hemoglobin 12.5 (*) 13.0 - 17.0 g/dL   HCT 36.5 (*) 39.0 - 52.0 %   MCV 95.3  78.0 - 100.0 fL   MCH 32.6  26.0 - 34.0 pg   MCHC 34.2  30.0 - 36.0 g/dL   RDW 15.6 (*) 11.5 - 15.5 %   Platelets 176  150 - 400 K/uL  BASIC METABOLIC PANEL     Status: Abnormal   Collection Time    11/01/13  3:30 AM      Result Value Ref Range   Sodium 144  137 - 147 mEq/L   Potassium 3.7  3.7 - 5.3 mEq/L   Chloride 107  96 - 112 mEq/L   CO2 24  19 - 32 mEq/L   Glucose, Bld 147 (*) 70 - 99 mg/dL   BUN 25 (*) 6 - 23 mg/dL   Creatinine, Ser 0.87  0.50 - 1.35 mg/dL   Calcium 8.8  8.4 - 10.5 mg/dL   GFR calc non Af Amer 72 (*) >90 mL/min   GFR calc Af Amer 84 (*) >90 mL/min   Comment:  (NOTE)     The eGFR has been calculated using the CKD EPI equation.     This calculation has not been validated in all clinical situations.     eGFR's persistently <90 mL/min signify possible Chronic Kidney     Disease.    MICRO: 4/7 blood cx ngtd 4/4 sputum cx rare c.albicans 4/4 urine cx MRSA (vanco S, bactrim  R, tetra R) 4/4 blood cx x 2 NGTD  Echo: - Left ventricle: The cavity size was normal. Wall thickness was increased in a pattern of mild LVH. The estimated ejection fraction was 55%. Wall motion was normal; there were no regional wall motion abnormalities. - Aortic valve: Can not rule out the possibility of a vegetation on the aortic valve. Heavy calcification of the aortic valve. Very little opening is seen. However, doppler data suggests only moderate AS. There is mild AI. Peak velocity: 229cm/s (S). Mean gradient: 58m Hg (S). - Mitral valve: Mild regurgitation. - Right ventricle: The cavity size was mildly dilated. Pacer wire or catheter noted in right ventricle. Systolic function was mildly reduced. - Right atrium: The atrium was mildly dilated. - Tricuspid valve: Moderate regurgitation.  Assessment/Plan:  89yoM with MMP with dementia admitted for sepsis due to PNA and MRSA UTI. No signs of bacteremia. Will not treat as infective endocarditis at this time but treat for complicated mrsa uti with 2 wks of iv vanco. Will do surveillance cultures to see if has signs of bacteremia at the end therapy  MRSA UTI = would treat as complicated uti, with vancomycin x 14 days. Will need picc line. Weekly bmp and vanco trough checked 2 x per week. Currently on day 5 of 14.  We will see him back in the ID clinic to do surveillance blood cx to determine if we need to extend therapy to treat for invasive mrsa infection.  PNA = currently on day 5 of 7 of antibiotics. Can change piptazo to cefuroxime if being discharged prior to finishing 7 days of antibiotics  Will see back in ID  clinic in 10 days.  CElzie RingsSPagelandfor Infectious Diseases [phone]

## 2013-11-02 ENCOUNTER — Inpatient Hospital Stay (HOSPITAL_COMMUNITY): Payer: Medicare Other

## 2013-11-02 DIAGNOSIS — Z22322 Carrier or suspected carrier of Methicillin resistant Staphylococcus aureus: Secondary | ICD-10-CM

## 2013-11-02 DIAGNOSIS — F03918 Unspecified dementia, unspecified severity, with other behavioral disturbance: Secondary | ICD-10-CM

## 2013-11-02 DIAGNOSIS — J189 Pneumonia, unspecified organism: Secondary | ICD-10-CM | POA: Diagnosis present

## 2013-11-02 DIAGNOSIS — N390 Urinary tract infection, site not specified: Secondary | ICD-10-CM

## 2013-11-02 DIAGNOSIS — F0391 Unspecified dementia with behavioral disturbance: Secondary | ICD-10-CM

## 2013-11-02 DIAGNOSIS — R197 Diarrhea, unspecified: Secondary | ICD-10-CM | POA: Diagnosis present

## 2013-11-02 LAB — BASIC METABOLIC PANEL
BUN: 21 mg/dL (ref 6–23)
CALCIUM: 8.6 mg/dL (ref 8.4–10.5)
CHLORIDE: 110 meq/L (ref 96–112)
CO2: 25 mEq/L (ref 19–32)
Creatinine, Ser: 0.88 mg/dL (ref 0.50–1.35)
GFR calc non Af Amer: 72 mL/min — ABNORMAL LOW (ref >90)
GFR, EST AFRICAN AMERICAN: 83 mL/min — AB (ref >90)
Glucose, Bld: 133 mg/dL — ABNORMAL HIGH (ref 70–99)
Potassium: 3.8 mEq/L (ref 3.7–5.3)
Sodium: 146 mEq/L (ref 137–147)

## 2013-11-02 MED ORDER — DIPHENOXYLATE-ATROPINE 2.5-0.025 MG PO TABS: 2.0000 | ORAL_TABLET | Freq: Four times a day (QID) | ORAL | Status: AC | PRN

## 2013-11-02 MED ORDER — LORAZEPAM 2 MG/ML IJ SOLN
1.0000 mg | Freq: Once | INTRAMUSCULAR | Status: AC
  Administered 2013-11-02: 1 mg via INTRAVENOUS
  Filled 2013-11-02: qty 1

## 2013-11-02 MED ORDER — VANCOMYCIN HCL 500 MG IV SOLR: INTRAVENOUS | Status: AC

## 2013-11-02 MED ORDER — HALOPERIDOL 2 MG PO TABS: 2.0000 mg | ORAL_TABLET | Freq: Three times a day (TID) | ORAL | Status: AC | PRN

## 2013-11-02 MED ORDER — SODIUM CHLORIDE 0.9 % IJ SOLN: 10.0000 mL | INTRAMUSCULAR | Status: DC | PRN

## 2013-11-02 MED ORDER — ENSURE COMPLETE PO LIQD: 237.0000 mL | Freq: Three times a day (TID) | ORAL | Status: AC

## 2013-11-02 MED ORDER — VANCOMYCIN HCL 500 MG IV SOLR: INTRAVENOUS | Status: DC

## 2013-11-02 NOTE — Discharge Summary (Addendum)
Physician Discharge Summary  ARNELL SLIVINSKI WUJ:[id number] DOB: 06/20/1918 DOA: 10/28/2013  PCP: Rogelia Boga, MD  Admit date: 10/28/2013 Discharge date: 11/02/2013   Recommendations for Outpatient Follow-Up:   1. Obtain surveillance blood cultures at the completion of IV vancomycin therapy. 2. Monitor renal function closely while on vancomycin therapy (at least every 3 days). 3. Followup final blood cultures drawn 10/28/13 and 10/31/13.   Discharge Diagnosis:  Principal Problem:    Sepsis secondary to CAP versus MRSA UTI Active Problems:    GERD    PACEMAKER, PERMANENT    Atrial fibrillation    Sepsis associated hypotension    Protein-calorie malnutrition, severe    Chronic diastolic CHF (congestive heart failure)    Aortic stenosis    Acute respiratory failure with hypoxia    Acute kidney injury    Underweight    Acute delirium    Dementia with behavioral disturbance    Decubitus ulcers    UTI (urinary tract infection)    MRSA carrier    Diarrhea   Discharge Condition: Stable.  Diet recommendation: Dysphasia 3   History of Present Illness:   [name] [name] is an 80 y.o. male with a PMH of atrial fibrillation, pacemaker, history of hip fracture and non-ambulatory/wheelchair-bound at baseline, BPH and dementia who was admitted on 10/28/13 with difficulty breathing secondary to right upper lobe pneumonia.   Hospital Course by Problem:   Principal Problem:  Sepsis secondary to right upper lobe pneumonia and MRSA UTI  Patient was admitted and a diagnostic workup was undertaken. Patient was septic with hypotension and lactic acidosis so he was treated with broad-spectrum antibiotics including vancomycin and Zosyn. He was initially monitored on the step down unit, transferred to the floor when he was hemodynamically stable. His blood pressure responded to fluid volume resuscitation. Blood cultures are negative to date. Urine cultures grew MRSA. Sputum  cultures grew mixed flora. Strep pneumonia and Legionella antigens were negative. C. difficile studies were negative. Echocardiogram done 10/30/13 showed a possible aortic valve vegetation. Family declined further evaluation with TEE. Seen by ID 11/01/13 with recommendations for treatment with 2 weeks of IV vancomycin. The patient will need surveillance blood cultures at the end of therapy. PICC line placed prior to discharge.  Active Problems:  Diarrhea  C. difficile negative. Continue Lomotil as needed.  Decubitus ulcers  Wound care per wound care RN recommendations: Cleanse all wounds with NS and pat gently dry. Apply Allevyn silicone border dressing to all areas. Change every 3 days and PRN soilage. If diarrhea becomes severe, discontinue Allevyn sacral dressing and use Mepitel silicone dressing covered with 4x4 and ABD pads.  Chronic diastolic CHF / aortic stenosis  Echocardiogram done 10/30/13 with EF of 55%. Aortic stenosis with a mean gradient of 16 mm Hg. Resume home diuretics at discharge.  Acute hypoxic respiratory failure  Secondary to pneumonia. Improved with empiric treatment as noted above. Maintaining oxygen saturations in the mid 90s.  Acute kidney injury  Likely from sepsis-induced hypotension. Creatinine 1.66 on admission, improved to 0.87 with fluid volume resuscitation.  GERD  Continue PPI therapy.  PACEMAKER, PERMANENT / Atrial fibrillation  The patient is not a candidate for anticoagulation secondary to fall and bleeding risk. He is status post permanent pacemaker. Heart rate is stable. Continue aspirin.  Sepsis associated hypotension  Hemodynamically stable status post fluid volume resuscitation.  Protein-calorie malnutrition, severe / underweight  Seen by dietitian 10/30/13. Continue Ensure supplements and dysphagia 3 diet.  Delirium/dementia with behavioral  disturbance  Continue as needed Haldol and supportive care.   Procedures:    2-D echo 10/30/13: EF 55%. Possible  aortic valve vegetation. Moderate aortic stenosis with a peak velocity of 229 cm/S., mean gradient 16 mm Hg. Right ventricular systolic dysfunction.  PICC line placed 11/02/13.    Medical Consultants:    None.   Discharge Exam:   Filed Vitals:   11/02/13 0615  BP: 161/57  Pulse: 61  Temp: 97.5 F (36.4 C)  Resp: 15   Filed Vitals:   11/01/13 0620 11/01/13 1301 11/01/13 2109 11/02/13 0615  BP: 116/58 173/75 152/69 161/57  Pulse: 94 63 49 61  Temp: 97.7 F (36.5 C) 96.5 F (35.8 C) 97.1 F (36.2 C) 97.5 F (36.4 C)  TempSrc: Oral Axillary Oral Oral  Resp: 18 18 16 15   Height:      Weight:      SpO2: 95% 92% 95% 93%    Gen:  NAD, lethargic Cardiovascular:  RRR, No M/R/G Respiratory: Lungs diminished Gastrointestinal: Abdomen soft, NT/ND with normal active bowel sounds. Extremities: No C/E/C    Discharge Instructions:        Discharge Orders   Future Appointments Provider Department Dept Phone   11/28/2013 3:45 PM Marinus Maw, MD [facility] [place] Office [phone]   Future Orders Complete By Expires   Call MD for:  persistant nausea and vomiting  As directed    Call MD for:  severe uncontrolled pain  As directed    Call MD for:  temperature >100.4  As directed    Diet - low sodium heart healthy  As directed    Scheduling Instructions:   Dysphasia 3   Discharge instructions  As directed    Increase activity slowly  As directed    Walk with assistance  As directed    Walker   As directed        Medication List         acetaminophen 325 MG tablet  Commonly known as:  TYLENOL  Take 650 mg by mouth every 6 (six) hours as needed (pain).     aspirin 81 MG tablet  Take 81 mg by mouth daily.     Calcium-Vitamin D 600-200 MG-UNIT Caps  Take 1 capsule by mouth 2 (two) times daily.     diphenoxylate-atropine 2.5-0.025 MG per tablet  Commonly known as:  LOMOTIL  Take 2 tablets by mouth 4 (four) times daily as needed for diarrhea or loose  stools.     esomeprazole 40 MG capsule  Commonly known as:  NEXIUM  Take 1 capsule (40 mg total) by mouth daily before breakfast.     feeding supplement (ENSURE COMPLETE) Liqd  Take 237 mLs by mouth 3 (three) times daily between meals.     furosemide 20 MG tablet  Commonly known as:  LASIX  Take 1 tablet (20 mg total) by mouth daily.     haloperidol 2 MG tablet  Commonly known as:  HALDOL  Take 1 tablet (2 mg total) by mouth every 8 (eight) hours as needed for agitation.     mometasone 50 MCG/ACT nasal spray  Commonly known as:  NASONEX  Place 2 sprays into the nose daily. As directed     multivitamin tablet  Take 1 tablet by mouth daily.     multivitamin-lutein Caps capsule  Take 1 capsule by mouth daily.     oxybutynin 5 MG tablet  Commonly known as:  DITROPAN  Take 5  mg by mouth 3 (three) times daily.     SYSTANE 0.4-0.3 % Soln  Generic drug:  Polyethyl Glycol-Propyl Glycol  Apply to eye 2 (two) times daily.     vancomycin in sodium chloride 0.9 % 100 mL  500 mg IV Q 12 hours. Give through 11/21/13.          The results of significant diagnostics from this hospitalization (including imaging, microbiology, ancillary and laboratory) are listed below for reference.     Significant Diagnostic Studies:   Radiographs: Dg Chest Port 1 View  10/28/2013   CLINICAL DATA:  Shortness  EXAM: PORTABLE CHEST - 1 VIEW  COMPARISON:  None.  FINDINGS: Cardiac shadow is within normal limits. The patient significant rotated to the right. Patchy right upper lobe infiltrate is seen consistent with acute pneumonia. No other focal infiltrate is noted. A pacing device is seen.  IMPRESSION: Right upper lobe pneumonia.   Electronically Signed   By: Alcide Clever M.D.   On: 10/28/2013 13:47    Labs:  Basic Metabolic Panel:  Recent Labs Lab 10/28/13 1252 10/28/13 1722 10/28/13 2331 10/29/13 0317 10/31/13 1815 11/01/13 0330 11/02/13 0337  NA 142  --  140 142 142 144 146  K 4.2  --   4.2 4.1 4.0 3.7 3.8  CL 101  --  104 105 102 107 110  CO2 28  --  25 28 25 24 25   GLUCOSE 121*  --  124* 126* 211* 147* 133*  BUN 41*  --  50* 50* 27* 25* 21  CREATININE 1.66*  --  1.32 1.27 0.86 0.87 0.88  CALCIUM 9.6  --  8.9 8.7 8.6 8.8 8.6  MG  --  2.1 2.5  --   --   --   --   PHOS  --  4.0  --   --   --   --   --    GFR Estimated Creatinine Clearance: 44.7 ml/min (by C-G formula based on Cr of 0.88). Liver Function Tests:  Recent Labs Lab 10/28/13 1252 10/29/13 0317  AST 14 14  ALT 9 10  ALKPHOS 81 74  BILITOT 0.9 0.6  PROT 6.4 5.5*  ALBUMIN 2.6* 2.2*   CBC:  Recent Labs Lab 10/28/13 1252 10/29/13 0317 10/31/13 1815 11/01/13 0330  WBC 13.3* 10.0 7.4 11.8*  NEUTROABS 11.7*  --  6.4  --   HGB 12.4* 12.1* 11.7* 12.5*  HCT 38.9* 36.4* 34.9* 36.5*  MCV 99.5 98.6 96.7 95.3  PLT 169 159 148* 176   Microbiology Recent Results (from the past 240 hour(s))  CULTURE, BLOOD (ROUTINE X 2)     Status: None   Collection Time    10/28/13 12:52 PM      Result Value Ref Range Status   Specimen Description BLOOD RIGHT ANTECUBITAL   Final   Special Requests BOTTLES DRAWN AEROBIC AND ANAEROBIC 5 CC EACH   Final   Culture  Setup Time     Final   Value: 10/28/2013 18:49     Performed at [facility]   Culture     Final   Value:        BLOOD CULTURE RECEIVED NO GROWTH TO DATE CULTURE WILL BE HELD FOR 5 DAYS BEFORE ISSUING A FINAL NEGATIVE REPORT     Performed at [facility]   Report Status PENDING   Incomplete  CULTURE, BLOOD (ROUTINE X 2)     Status: None   Collection Time  10/28/13  1:00 PM      Result Value Ref Range Status   Specimen Description BLOOD RIGHT ANTECUBITAL   Final   Special Requests BOTTLES DRAWN AEROBIC AND ANAEROBIC 2 CC   Final   Culture  Setup Time     Final   Value: 10/28/2013 18:48     Performed at [facility]   Culture     Final   Value:        BLOOD CULTURE RECEIVED NO GROWTH TO DATE CULTURE WILL BE HELD FOR 5 DAYS  BEFORE ISSUING A FINAL NEGATIVE REPORT     Performed at [facility]   Report Status PENDING   Incomplete  URINE CULTURE     Status: None   Collection Time    10/28/13  1:53 PM      Result Value Ref Range Status   Specimen Description URINE, CATHETERIZED   Final   Special Requests NONE   Final   Culture  Setup Time     Final   Value: 10/28/2013 21:42     Performed at [facility] Count     Final   Value: >=100,000 COLONIES/ML     Performed at [facility]   Culture     Final   Value: METHICILLIN RESISTANT STAPHYLOCOCCUS AUREUS     Note: RIFAMPIN AND GENTAMICIN SHOULD NOT BE USED AS SINGLE DRUGS FOR TREATMENT OF STAPH INFECTIONS. CRITICAL RESULT CALLED TO, READ BACK BY AND VERIFIED WITH: Creed Copper @ 12:40PM 10/30/13 BY DWEEKS     Performed at [facility]   Report Status 10/30/2013 FINAL   Final   Organism ID, Bacteria METHICILLIN RESISTANT STAPHYLOCOCCUS AUREUS   Final  MRSA PCR SCREENING     Status: Abnormal   Collection Time    10/28/13  5:10 PM      Result Value Ref Range Status   MRSA by PCR POSITIVE (*) NEGATIVE Final   Comment:            The GeneXpert MRSA Assay (FDA     approved for NASAL specimens     only), is one component of a     comprehensive MRSA colonization     surveillance program. It is not     intended to diagnose MRSA     infection nor to guide or     monitor treatment for     MRSA infections.     RESULT CALLED TO, READ BACK BY AND VERIFIED WITH:     C.SMITH AT 1822 ON [id number] BY C.BONGEL  CULTURE, EXPECTORATED SPUTUM-ASSESSMENT     Status: None   Collection Time    10/28/13  9:55 PM      Result Value Ref Range Status   Specimen Description SPUTUM   Final   Special Requests Immunocompromised   Final   Sputum evaluation     Final   Value: THIS SPECIMEN IS ACCEPTABLE. RESPIRATORY CULTURE REPORT TO FOLLOW.   Report Status 10/28/2013 FINAL   Final  CULTURE, RESPIRATORY (NON-EXPECTORATED)     Status: None    Collection Time    10/28/13  9:55 PM      Result Value Ref Range Status   Specimen Description SPUTUM   Final   Special Requests NONE   Final   Gram Stain     Final   Value: ABUNDANT WBC PRESENT, PREDOMINANTLY PMN     FEW SQUAMOUS EPITHELIAL CELLS PRESENT     MODERATE YEAST  MODERATE GRAM POSITIVE RODS     FEW GRAM NEGATIVE RODS   Culture     Final   Value: RARE CANDIDA ALBICANS     [facility]   Report Status 10/31/2013 FINAL   Final  CLOSTRIDIUM DIFFICILE BY PCR     Status: None   Collection Time    10/29/13  7:41 AM      Result Value Ref Range Status   C difficile by pcr NEGATIVE  NEGATIVE Final   Comment: [facility]  CULTURE, BLOOD (ROUTINE X 2)     Status: None   Collection Time    10/31/13  6:15 PM      Result Value Ref Range Status   Specimen Description BLOOD RIGHT ARM   Final   Special Requests BOTTLES DRAWN AEROBIC AND ANAEROBIC 10CC   Final   Culture  Setup Time     Final   Value: 10/31/2013 23:10     Performed at [facility]   Culture     Final   Value:        BLOOD CULTURE RECEIVED NO GROWTH TO DATE CULTURE WILL BE HELD FOR 5 DAYS BEFORE ISSUING A FINAL NEGATIVE REPORT     Performed at [facility]   Report Status PENDING   Incomplete  CULTURE, BLOOD (ROUTINE X 2)     Status: None   Collection Time    10/31/13  6:25 PM      Result Value Ref Range Status   Specimen Description BLOOD LEFT ARM   Final   Special Requests BOTTLES DRAWN AEROBIC AND ANAEROBIC 10CC   Final   Culture  Setup Time     Final   Value: 10/31/2013 23:10     Performed at [facility]   Culture     Final   Value:        BLOOD CULTURE RECEIVED NO GROWTH TO DATE CULTURE WILL BE HELD FOR 5 DAYS BEFORE ISSUING A FINAL NEGATIVE REPORT     Performed at [facility]   Report Status PENDING   Incomplete    Time coordinating discharge: 35 minutes.  SignedMaryruth Bun:  Berlin Viereck P Benjy Kana  Pager [phone] [facility] 11/02/2013, 11:31 AM

## 2013-11-02 NOTE — Progress Notes (Signed)
Peripherally Inserted Central Catheter/Midline Placement  The IV Nurse has discussed with the patient and/or persons authorized to consent for the patient, the purpose of this procedure and the potential benefits and risks involved with this procedure.  The benefits include less needle sticks, lab draws from the catheter and patient may be discharged home with the catheter.  Risks include, but not limited to, infection, bleeding, blood clot (thrombus formation), and puncture of an artery; nerve damage and irregular heat beat.  Alternatives to this procedure were also discussed.  PICC/Midline Placement Documentation        Lisabeth DevoidLucynda Jeanette Tessi Eustache 11/02/2013, 11:14 AM Consent obtained 11/01/13  By Ronette DeterJessica Poff, RN, IV Team from family

## 2013-11-02 NOTE — Progress Notes (Signed)
Pt for discharge to [facility].   CSW facilitated pt discharge needs including contacting facility, faxing pt discharge information via [facility], discussing with pt wife via telephone, providing RN phone number to call report, and arranging ambulance transport for pt [facility]. (Service Request ID# [id number]).  No further social work needs identified at this time.  CSW signing off.   Loletta SpecterSuzanna Tunisia Landgrebe, MSW, LCSW Clinical Social Work [phone]

## 2013-11-02 NOTE — Progress Notes (Signed)
Pt. Has been [facility].  Report was called to nurse at facility.

## 2013-11-03 ENCOUNTER — Encounter: Payer: Self-pay | Admitting: *Deleted

## 2013-11-03 ENCOUNTER — Encounter: Payer: Self-pay | Admitting: Adult Health

## 2013-11-03 ENCOUNTER — Non-Acute Institutional Stay (SKILLED_NURSING_FACILITY): Payer: Medicare Other | Admitting: Adult Health

## 2013-11-03 DIAGNOSIS — I350 Nonrheumatic aortic (valve) stenosis: Secondary | ICD-10-CM

## 2013-11-03 DIAGNOSIS — A419 Sepsis, unspecified organism: Secondary | ICD-10-CM

## 2013-11-03 DIAGNOSIS — I4891 Unspecified atrial fibrillation: Secondary | ICD-10-CM

## 2013-11-03 DIAGNOSIS — I509 Heart failure, unspecified: Secondary | ICD-10-CM

## 2013-11-03 DIAGNOSIS — I359 Nonrheumatic aortic valve disorder, unspecified: Secondary | ICD-10-CM

## 2013-11-03 DIAGNOSIS — F0391 Unspecified dementia with behavioral disturbance: Secondary | ICD-10-CM

## 2013-11-03 DIAGNOSIS — F03918 Unspecified dementia, unspecified severity, with other behavioral disturbance: Secondary | ICD-10-CM

## 2013-11-03 DIAGNOSIS — N390 Urinary tract infection, site not specified: Secondary | ICD-10-CM

## 2013-11-03 DIAGNOSIS — K219 Gastro-esophageal reflux disease without esophagitis: Secondary | ICD-10-CM

## 2013-11-03 DIAGNOSIS — I5032 Chronic diastolic (congestive) heart failure: Secondary | ICD-10-CM

## 2013-11-03 DIAGNOSIS — L8990 Pressure ulcer of unspecified site, unspecified stage: Secondary | ICD-10-CM

## 2013-11-03 DIAGNOSIS — E43 Unspecified severe protein-calorie malnutrition: Secondary | ICD-10-CM

## 2013-11-03 LAB — CULTURE, BLOOD (ROUTINE X 2)
CULTURE: NO GROWTH
Culture: NO GROWTH

## 2013-11-03 NOTE — Progress Notes (Signed)
Patient ID: [name] [name], male   DOB: [birth year], 80 y.o.   MRN: [id number]               PROGRESS NOTE  DATE: 11/03/2013  FACILITY: Nursing Home Location: [facility]  LEVEL OF CARE: SNF (31)  Acute Visit  CHIEF COMPLAINT:  Follow-up hospitalization  HISTORY OF PRESENT ILLNESS: This is a 80 year old male who has been admitted to [facility] on 11/02/13 from [facility] with principal discharge diagnosis of Sepsis secondary to CAP versus MRSA UTI. He has been admitted for a short-term rehabilitation.  REASSESSMENT OF ONGOING PROBLEM(S):  CHF:The patient does not relate significant weight changes, denies sob, DOE, orthopnea, PNDs, pedal edema, palpitations or chest pain.  CHF remains stable.  No complications form the medications being used.  DEMENTIA: The dementia remaines stable and continues to function adequately in the current living environment with supervision.  The patient has had little changes in behavior. No complications noted from the medications presently being used.  GERD: pt's GERD is stable.  Denies ongoing heartburn, abd. Pain, nausea or vomiting.  Currently on a PPI & tolerates it without any adverse reactions.  PAST MEDICAL HISTORY : Reviewed.  No changes.  CURRENT MEDICATIONS: Reviewed per [facility]  REVIEW OF SYSTEMS:  GENERAL: no change in appetite, no fatigue, no weight changes, no fever, chills or weakness RESPIRATORY: no cough, SOB, DOE, wheezing, hemoptysis CARDIAC: no chest pain, edema or palpitations GI: no abdominal pain, diarrhea, constipation, heart burn, nausea or vomiting  PHYSICAL EXAMINATION  GENERAL: no acute distress, normal body habitus SKIN: sacral ulcers with dry dressing EYES: conjunctivae normal, sclerae normal, normal eye lids NECK: supple, trachea midline, no neck masses, no thyroid tenderness, no thyromegaly LYMPHATICS: no LAN in the neck, no supraclavicular LAN RESPIRATORY: breathing is even & unlabored, BS  CTAB CARDIAC: RRR, no murmur,no extra heart sounds, no edema GI: abdomen soft, normal BS, no masses, no tenderness, no hepatomegaly, no splenomegaly GU:  + foley catheter intact draining to urine bag with amber urine EXTREMITIES: + PICC on LUE single lumen intact, patent PSYCHIATRIC: the patient is alert & oriented to person, affect & behavior appropriate  LABS/RADIOLOGY: Labs reviewed: Basic Metabolic Panel:  Recent Labs  40/98/11 1004 10/28/13 1252 10/28/13 1722 10/28/13 2331  10/31/13 1815 11/01/13 0330 11/02/13 0337  NA 144 142  --  140  < > 142 144 146  K 4.4 4.2  --  4.2  < > 4.0 3.7 3.8  CL 103 101  --  104  < > 102 107 110  CO2 33* 28  --  25  < > 25 24 25   GLUCOSE 100* 121*  --  124*  < > 211* 147* 133*  BUN 29* 41*  --  50*  < > 27* 25* 21  CREATININE 1.0 1.66*  --  1.32  < > 0.86 0.87 0.88  CALCIUM 9.7 9.6  --  8.9  < > 8.6 8.8 8.6  MG 1.9  --  2.1 2.5  --   --   --   --   PHOS  --   --  4.0  --   --   --   --   --   < > = values in this interval not displayed. Liver Function Tests:  Recent Labs  01/16/13 1004 10/28/13 1252 10/29/13 0317  AST 18 14 14   ALT 12 9 10   ALKPHOS 87 81 74  BILITOT 1.5* 0.9 0.6  PROT 7.0 6.4 5.5*  ALBUMIN 3.8 2.6* 2.2*   CBC:  Recent Labs  01/16/13 1004 10/28/13 1252 10/29/13 0317 10/31/13 1815 11/01/13 0330  WBC 7.3 13.3* 10.0 7.4 11.8*  NEUTROABS 5.8 11.7*  --  6.4  --   HGB 13.9 12.4* 12.1* 11.7* 12.5*  HCT 41.6 38.9* 36.4* 34.9* 36.5*  MCV 98.6 99.5 98.6 96.7 95.3  PLT 170.0 169 159 148* 176    ASSESSMENT/PLAN:  Sepsis secondary to CAP versus MRSA UTI - continue vancomycin IV GERD - continue Prilosec Atrial fibrillation - has  A permanent pacemaker; not candidate for anticoagulant due to risk of bleeding; on ASA Protein calorie malnutrition, severe - continue nutritional supplementation Chronic diastolic CHF - stable; continue Lasix Aortic stenosis - continue aspirin Decubitus ulcer - continue  treatment Dementia with behavioral disturbances - continue Haldol when necessary UTI - continue vancomycin   CPT CODE: 1610999309  Ella BodoMonina Vargas - NP [facility] [phone]

## 2013-11-06 LAB — CULTURE, BLOOD (ROUTINE X 2)
CULTURE: NO GROWTH
Culture: NO GROWTH

## 2013-11-13 ENCOUNTER — Non-Acute Institutional Stay (SKILLED_NURSING_FACILITY): Payer: Medicare Other | Admitting: Internal Medicine

## 2013-11-13 DIAGNOSIS — I5032 Chronic diastolic (congestive) heart failure: Secondary | ICD-10-CM

## 2013-11-13 DIAGNOSIS — I509 Heart failure, unspecified: Secondary | ICD-10-CM

## 2013-11-13 DIAGNOSIS — E43 Unspecified severe protein-calorie malnutrition: Secondary | ICD-10-CM

## 2013-11-13 DIAGNOSIS — F03918 Unspecified dementia, unspecified severity, with other behavioral disturbance: Secondary | ICD-10-CM

## 2013-11-13 DIAGNOSIS — F0391 Unspecified dementia with behavioral disturbance: Secondary | ICD-10-CM

## 2013-11-13 DIAGNOSIS — I4891 Unspecified atrial fibrillation: Secondary | ICD-10-CM

## 2013-11-13 NOTE — Progress Notes (Signed)
HISTORY & PHYSICAL  DATE: 11/13/2013   FACILITY: [facility]  LEVEL OF CARE: SNF (31)  ALLERGIES:  Allergies  Allergen Reactions  . Cortisone Acetate     REACTION: reacted to injection in back    CHIEF COMPLAINT:  Manage pt is a 80 y/o Caucasian male who was hospitalized for CAP & MRSA UTI, then admitted to this facility for SNF rehab.  ATRIAL FIBRILLATION: the patients atrial fibrillation remains stable.  The patient denies DOE, tachycardia, orthopnea, transient neurological sx, pedal edema, palpitations, & PNDs.  No complications noted from the medications currently being used.  CHF:The patient does not relate significant weight changes, denies sob, DOE, orthopnea, PNDs, pedal edema, palpitations or chest pain.  CHF remains stable.  No complications form the medications being used.  DEMENTIA: The dementia remaines stable and continues to function adequately in the current living environment with supervision.  The patient has had little changes in behavior. No complications noted from the medications presently being used.  HISTORY OF PRESENT ILLNESS:  PAST MEDICAL HISTORY :  Past Medical History  Diagnosis Date  . ATHEROSCLEROSIS W /INT CLAUDICATION 06/06/2008  . AV BLOCK, COMPLETE 06/06/2008  . BENIGN PROSTATIC HYPERTROPHY, WITH OBSTRUCTION 09/02/2009  . DVT, HX OF 06/04/2009  . OSTEOARTHRITIS, KNEES, BILATERAL 07/12/2008  . PACEMAKER, PERMANENT 06/06/2008  . Pruritus   . Decubitus ulcer, buttock   . Atrial fibrillation   . PVD (peripheral vascular disease)   . Barrett esophagus   . MRSA carrier 11/01/2013  . Aortic stenosis 11/01/2013  . Dementia with behavioral disturbance 11/01/2013  . Protein-calorie malnutrition, severe 10/30/2013  . Underweight 11/01/2013  . CHF (congestive heart failure)     chronic diastolic  . Ulcer     decubitus, lower back  . GERD 05/16/2007  . HYPERTENSION 05/16/2007    PAST SURGICAL HISTORY: Past Surgical History    Procedure Laterality Date  . Cataract extraction    . Skin graft      to heel  . Pacemaker insertion    . Tonsillectomy    . Fracture surgery      hip    SOCIAL HISTORY:  reports that he quit smoking about 69 years ago. He has never used smokeless tobacco. He reports that he does not drink alcohol or use illicit drugs.  FAMILY HISTORY: none  CURRENT MEDICATIONS: Reviewed per MAR/see medication list  REVIEW OF SYSTEMS:  See HPI otherwise 14 point ROS is negative.  PHYSICAL EXAMINATION  VS:  See VS section  GENERAL: no acute distress, normal body habitus EYES: conjunctivae normal, sclerae normal, normal eye lids MOUTH/THROAT: lips without lesions,no lesions in the mouth,tongue is without lesions,uvula elevates in midline NECK: supple, trachea midline, no neck masses, no thyroid tenderness, no thyromegaly LYMPHATICS: no LAN in the neck, no supraclavicular LAN RESPIRATORY: breathing is even & unlabored, BS CTAB CARDIAC: heart rate is irregularly irregular, no murmur,no extra heart sounds, no edema GI:  ABDOMEN: abdomen soft, normal BS, no masses, no tenderness  LIVER/SPLEEN: no hepatomegaly, no splenomegaly MUSCULOSKELETAL: HEAD: normal to inspection & palpation BACK: no kyphosis, scoliosis or spinal processes tenderness EXTREMITIES: LEFT UPPER EXTREMITY: moderate range of motion, decreased strength & tone RIGHT UPPER EXTREMITY:  moderate range of motion, decreased strength & tone LEFT LOWER EXTREMITY:  Minimal  range of motion,  strength & tone unable to check RIGHT LOWER EXTREMITY:  Minimal range of motion,  strength & tone unable to check PSYCHIATRIC: the patient  is alert & oriented to person, affect & behavior appropriate  LABS/RADIOLOGY:  Labs reviewed: Basic Metabolic Panel:  Recent Labs  96/10/5404/23/14 1004 10/28/13 1252 10/28/13 1722 10/28/13 2331  10/31/13 1815 11/01/13 0330 11/02/13 0337  NA 144 142  --  140  < > 142 144 146  K 4.4 4.2  --  4.2  < > 4.0 3.7  3.8  CL 103 101  --  104  < > 102 107 110  CO2 33* 28  --  25  < > 25 24 25   GLUCOSE 100* 121*  --  124*  < > 211* 147* 133*  BUN 29* 41*  --  50*  < > 27* 25* 21  CREATININE 1.0 1.66*  --  1.32  < > 0.86 0.87 0.88  CALCIUM 9.7 9.6  --  8.9  < > 8.6 8.8 8.6  MG 1.9  --  2.1 2.5  --   --   --   --   PHOS  --   --  4.0  --   --   --   --   --   < > = values in this interval not displayed. Liver Function Tests:  Recent Labs  01/16/13 1004 10/28/13 1252 10/29/13 0317  AST 18 14 14   ALT 12 9 10   ALKPHOS 87 81 74  BILITOT 1.5* 0.9 0.6  PROT 7.0 6.4 5.5*  ALBUMIN 3.8 2.6* 2.2*   CBC:  Recent Labs  01/16/13 1004 10/28/13 1252 10/29/13 0317 10/31/13 1815 11/01/13 0330  WBC 7.3 13.3* 10.0 7.4 11.8*  NEUTROABS 5.8 11.7*  --  6.4  --   HGB 13.9 12.4* 12.1* 11.7* 12.5*  HCT 41.6 38.9* 36.4* 34.9* 36.5*  MCV 98.6 99.5 98.6 96.7 95.3  PLT 170.0 169 159 148* 176    Transthoracic Echocardiography  Patient:    [name] [name], [name] [name] MR #:       [id number] Study Date: 10/30/2013 Gender:     M Age:        80 Height:     182.9cm Weight:     60.3kg BSA:        1.3921m^2 Pt. Status: Room:       1225    ADMITTING    Penny PiaVega, [place]  SONOGRAPHER  Jimmy Reel, RDCS  ATTENDING    Buriev, Ulugbek  ORDERING     Buriev, Ulugbek  REFERRING    Buriev, Ulugbek  PERFORMING   Chmg, Inpatient cc:  ------------------------------------------------------------ LV EF: 55%  ------------------------------------------------------------ Indications:      3rd degree AV block complete 426.0.  CHF - 428.0.  Sepsis 038.9Septic shock 785.59.  ------------------------------------------------------------ History:   PMH:   Atrial fibrillation.  Risk factors: Hypertension. Dyslipidemia.  ------------------------------------------------------------ Study Conclusions  - Left ventricle: The cavity size was normal. Wall thickness   was increased in a pattern of mild LVH. The estimated   ejection fraction was  55%. Wall motion was normal; there   were no regional wall motion abnormalities. - Aortic valve: Can not rule out the possibility of a   vegetation on the aortic valve. Heavy calcification of the   aortic valve. Very little opening is seen. However,   doppler data suggests only moderate AS. There is mild AI.   Peak velocity: 229cm/s (S). Mean gradient: 16mm Hg (S). - Mitral valve: Mild regurgitation. - Right ventricle: The cavity size was mildly dilated. Pacer   wire or catheter noted in right ventricle. Systolic   function was mildly  reduced. - Right atrium: The atrium was mildly dilated. - Tricuspid valve: Moderate regurgitation. Transthoracic echocardiography.  M-mode, complete 2D, spectral Doppler, and color Doppler.  Height:  Height: 182.9cm. Height: 72in.  Weight:  Weight: 60.3kg. Weight: 132.7lb.  Body mass index:  BMI: 18kg/m^2.  Body surface area:    BSA: 1.30m^2.  Blood pressure:     102/70.  Patient status:  Inpatient.  Location:  Bedside.  ------------------------------------------------------------  ------------------------------------------------------------ Left ventricle:  The cavity size was normal. Wall thickness was increased in a pattern of mild LVH. The estimated ejection fraction was 55%. Wall motion was normal; there were no regional wall motion abnormalities.  ------------------------------------------------------------ Aortic valve:  Can not rule out the possibility of a vegetation on the aortic valve. Heavy calcification of the aortic valve. Very little opening is seen. However, doppler data suggests only moderate AS. There is mild AI.  Doppler:    Valve area: 1.73cm^2(VTI). Indexed valve area: 0.97cm^2/m^2 (VTI). Peak velocity ratio of LVOT to aortic valve: 0.58. Valve area: 1.82cm^2 (Vmax). Indexed valve area: 1.02cm^2/m^2 (Vmax).    Mean gradient: 16mm Hg (S). Peak gradient: 21mm Hg  (S).  ------------------------------------------------------------ Aorta:  Aortic root: The aortic root was normal in size.  ------------------------------------------------------------ Mitral valve:   Mildly thickened leaflets .  Doppler:   Mild regurgitation.  ------------------------------------------------------------ Left atrium:  The atrium was at the upper limits of normal in size.  ------------------------------------------------------------ Right ventricle:  The cavity size was mildly dilated. Pacer wire or catheter noted in right ventricle. Systolic function was mildly reduced.  ------------------------------------------------------------ Pulmonic valve:    The valve appears to be grossly normal.  Doppler:   Trivial regurgitation.  ------------------------------------------------------------ Tricuspid valve:   The valve appears to be grossly normal.  Doppler:   Moderate regurgitation.  ------------------------------------------------------------ Right atrium:  The atrium was mildly dilated.  ------------------------------------------------------------ Pericardium:  There was no pericardial effusion.  ------------------------------------------------------------  2D measurements        Normal  Doppler measurements   Norma Left ventricle                                        l LVID ED,   34.9 mm     43-52   LVOT chord,                         Peak vel, 133 cm/s     ----- PLAX                           S LVID ES,   22.8 mm     23-38   Peak        7 mm Hg    ----- chord,                         gradient, PLAX                           S FS, chord,   35 %      >29     Aortic valve PLAX                           Peak vel, 229 cm/s     ----- LVPW, ED  11.1 mm     ------  S IVS/LVPW   0.74        <1.3    Mean vel, 188 cm/s     ----- ratio, ED                      S Ventricular septum             VTI, S     48 cm       ----- IVS, ED    8.17 mm     ------  Mean        16 mm Hg    ----- LVOT                           gradient, Diam, S      20 mm     ------  S Area       3.14 cm^2   ------  Peak       21 mm Hg    ----- Aorta                          gradient, Root diam,   37 mm     ------  S ED                             Area, VTI 1.7 cm^2     ----- Left atrium                                3 AP dim       27 mm     ------  Area      0.9 cm^2/m^2 ----- AP dim     1.51 cm/m^2 <2.2    index       7 index                          (VTI)                                Peak vel  0.5          -----                                ratio,      8                                LVOT/AV                                Area,     1.8 cm^2     -----                                Vmax        2                                Area  1.0 cm^2/m^2 -----                                index       2                                (Vmax)                                Tricuspid valve                                Regurg    339 cm/s     -----                                peak vel                                Peak       46 mm Hg    -----                                RV-RA                                gradient,                                S                                Max       339 cm/s     -----                                regurg                                vel   PORTABLE CHEST - 1 VIEW   COMPARISON:  DG CHEST 1V PORT dated 10/28/2013   FINDINGS: A right PICC line via a subclavian approach is appreciated tip projecting in the region of superior vena caval right atrial junction. No pneumothorax. Cardiac silhouette is enlarged. A left chest wall dual-chamber cardiac pacer appreciated. Increased confluence in the bilateral pulmonary opacities. There is blunting of the costophrenic angles. Degenerative changes are appreciated within the shoulders. No acute osseous abnormalities.   IMPRESSION: Right-sided PICC line tip projecting in the region  superior vena cava right atrial junction.   Increased conspicuity of the bilateral pulmonary opacities consistent with diffuse pneumonitis.   Small pleural effusions left greater than right.   ASSESSMENT/PLAN:  Atrial fibrillation-rate controlled CHF-compensated Dementia-stable Severe protein calorie malnutrition-cont. Supplements Anemia of chronic dz-check cbc  I have reviewed patient's medical records received at admission/from hospitalization.  CPT CODE: 16109  Angela Cox, MD [facility] [phone]

## 2013-11-24 DEATH — deceased

## 2013-11-28 ENCOUNTER — Encounter: Payer: Medicare Other | Admitting: Internal Medicine

## 2015-01-12 IMAGING — CR DG CHEST 1V PORT
1 series · 1 of 1 positions shown · non-contrast
Comparison: None.

CLINICAL DATA: Shortness

EXAM:
PORTABLE CHEST - 1 VIEW

[AP]
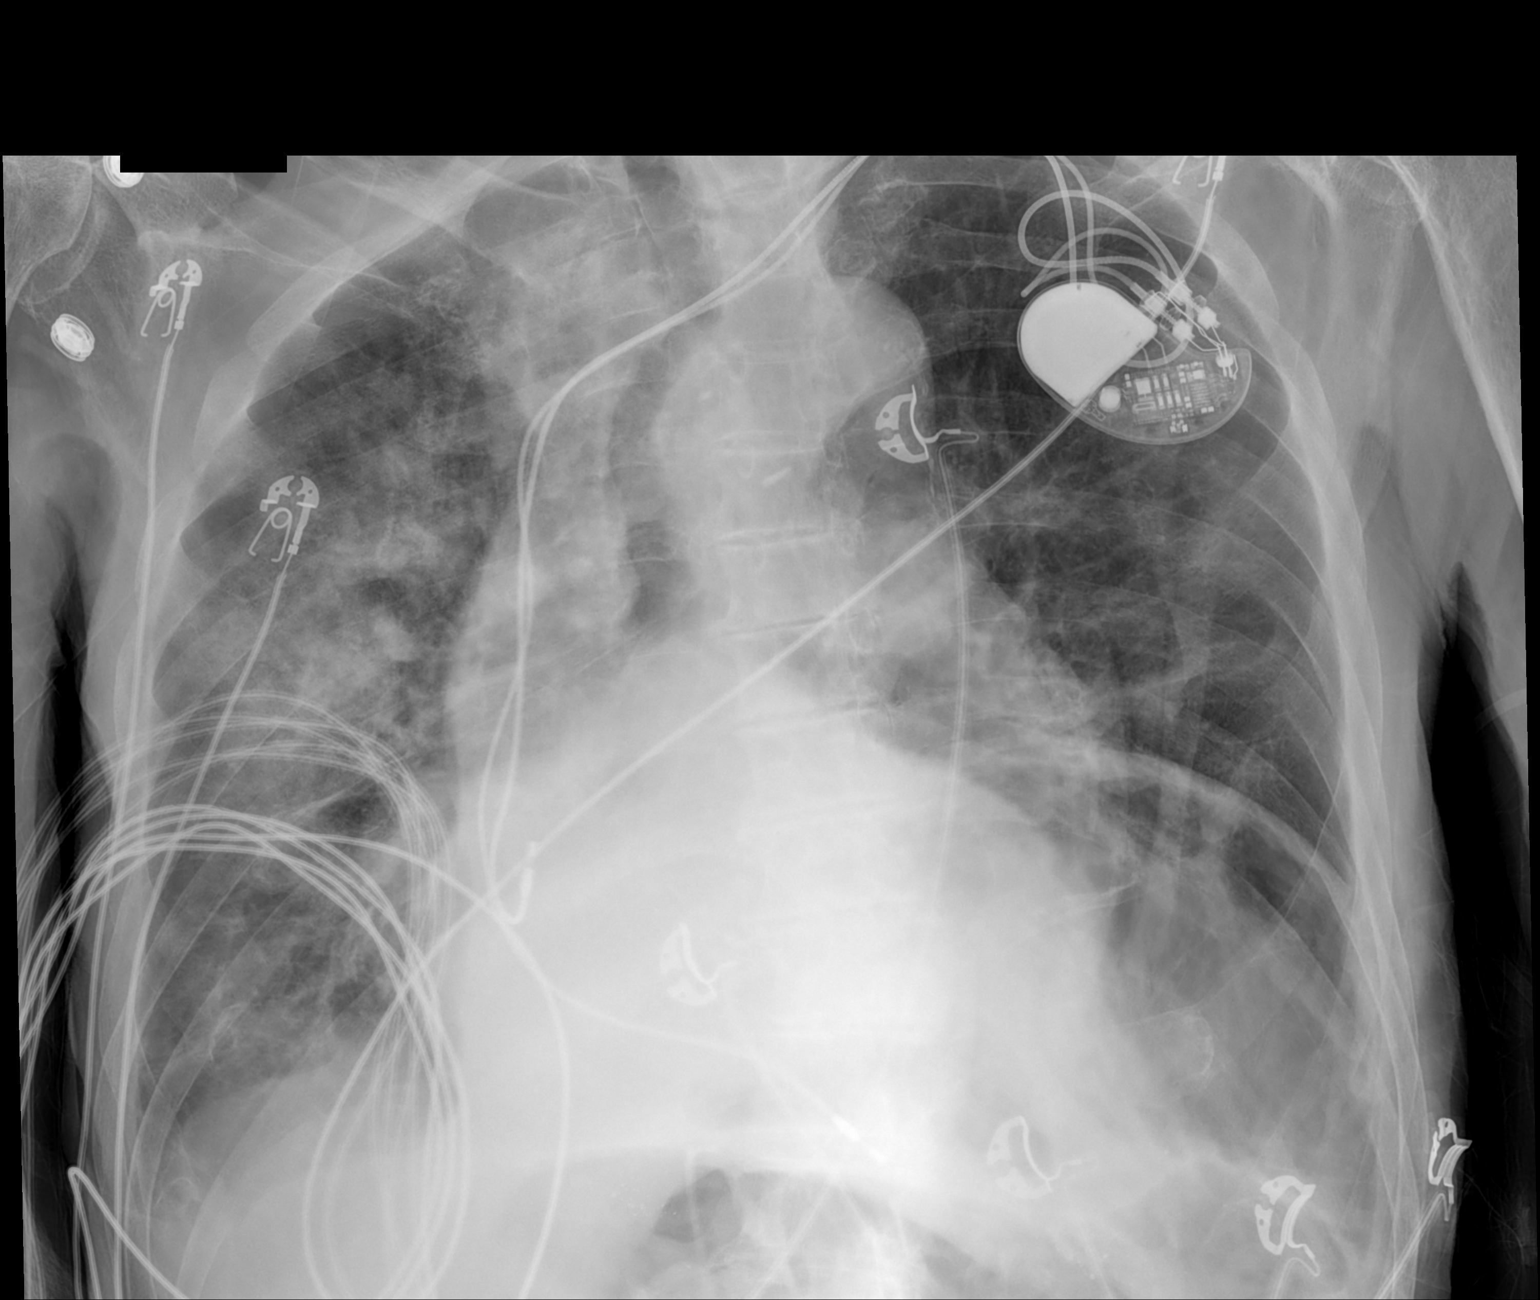

[1 of 1 positions shown; findings below may reference images not displayed]

FINDINGS: Cardiac shadow is within normal limits. The patient significant
rotated to the right. Patchy right upper lobe infiltrate is seen
consistent with acute pneumonia. No other focal infiltrate is noted.
A pacing device is seen.
IMPRESSION: Right upper lobe pneumonia.

## 2015-01-17 IMAGING — CR DG CHEST 1V PORT
1 series · 1 of 1 positions shown · non-contrast
Comparison: DG CHEST 1V PORT dated 10/28/2013

CLINICAL DATA: confirm line placement

EXAM:
PORTABLE CHEST - 1 VIEW

[AP]
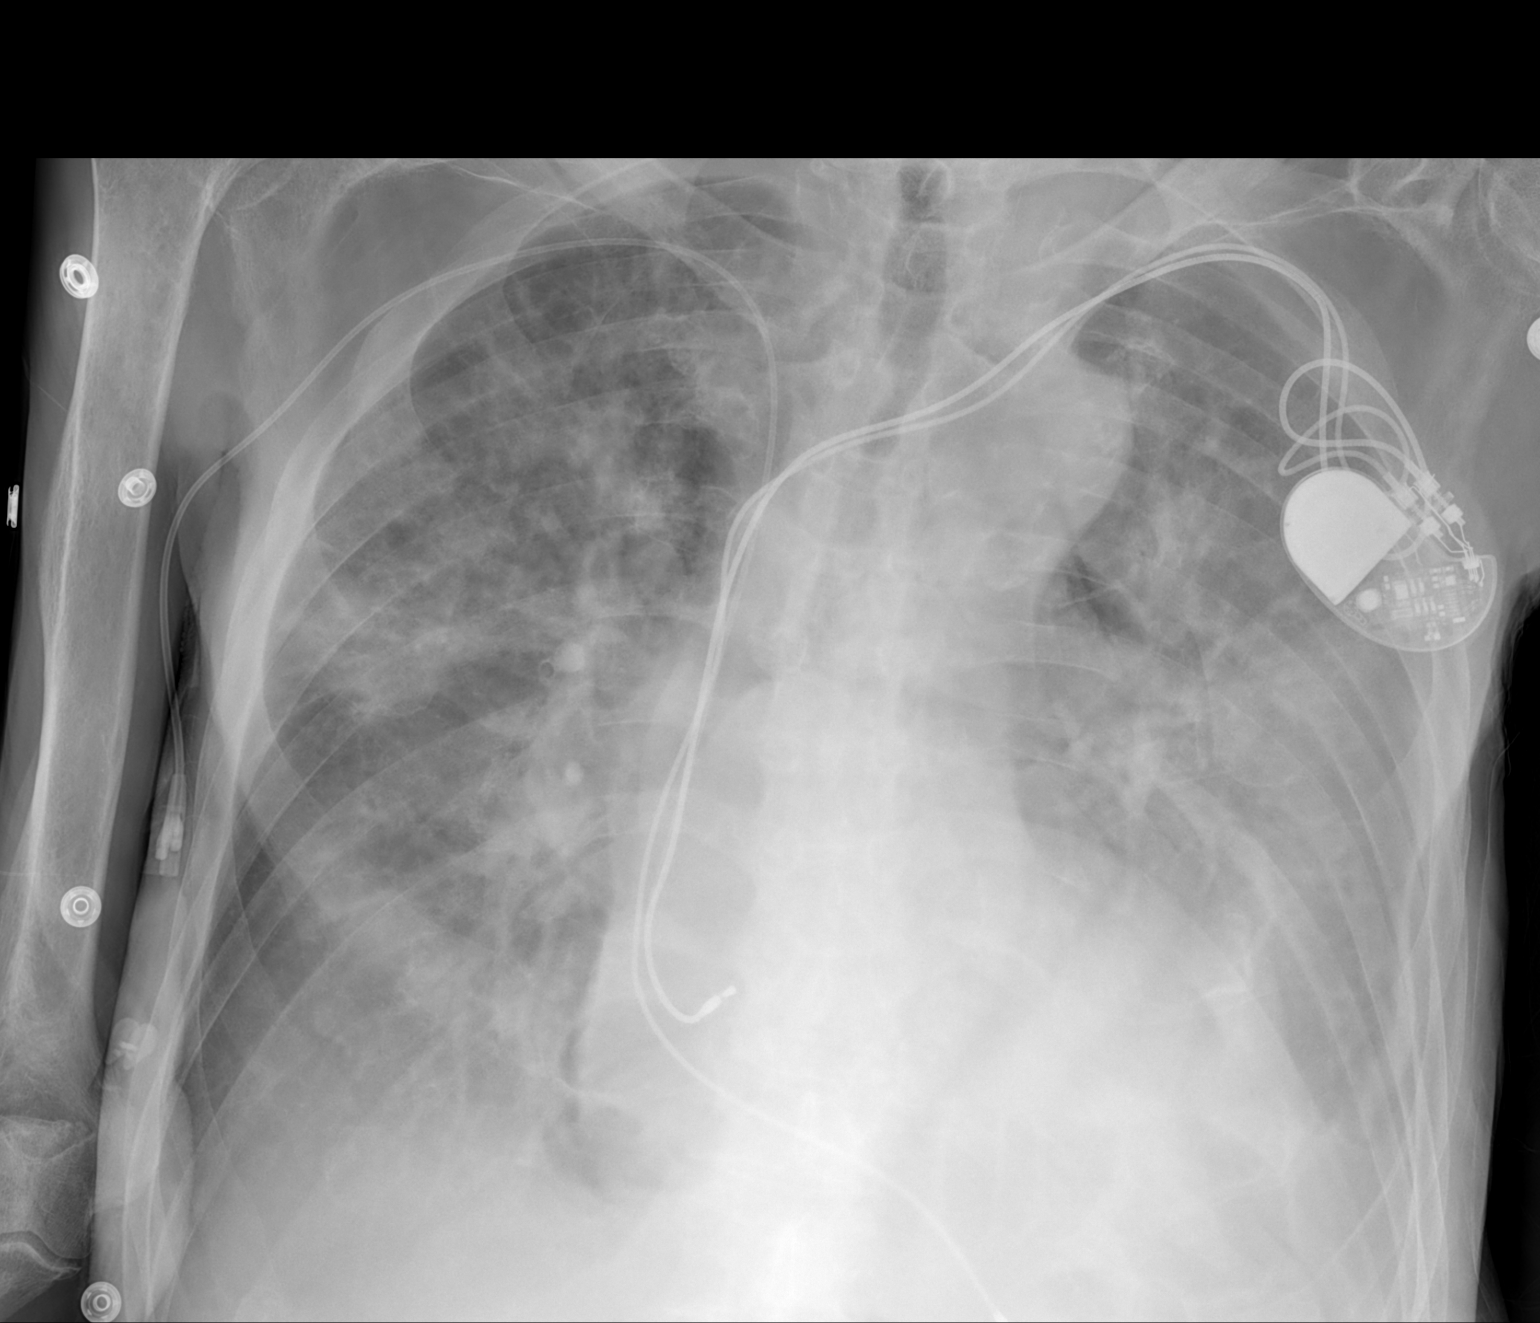

[1 of 1 positions shown; findings below may reference images not displayed]

FINDINGS: A right PICC line via a subclavian approach is appreciated tip
projecting in the region of superior vena caval right atrial
junction. No pneumothorax. Cardiac silhouette is enlarged. A left
chest wall dual-chamber cardiac pacer appreciated. Increased
confluence in the bilateral pulmonary opacities. There is blunting
of the costophrenic angles. Degenerative changes are appreciated
within the shoulders. No acute osseous abnormalities.
IMPRESSION: Right-sided PICC line tip projecting in the region superior vena
cava right atrial junction.

Increased conspicuity of the bilateral pulmonary opacities
consistent with diffuse pneumonitis.

Small pleural effusions left greater than right.
